# Patient Record
Sex: Female | Born: 1995 | Race: White | Hispanic: No | Marital: Single | Smoking: Never smoker
Health system: Southern US, Community
[De-identification: ages and names within clinical notes are randomized; demographics above are authoritative.]

## PROBLEM LIST (undated history)

## (undated) ENCOUNTER — Inpatient Hospital Stay (HOSPITAL_COMMUNITY): Payer: Self-pay

## (undated) DIAGNOSIS — R51 Headache: Secondary | ICD-10-CM

## (undated) DIAGNOSIS — R55 Syncope and collapse: Secondary | ICD-10-CM

## (undated) DIAGNOSIS — A609 Anogenital herpesviral infection, unspecified: Secondary | ICD-10-CM

## (undated) DIAGNOSIS — R197 Diarrhea, unspecified: Secondary | ICD-10-CM

## (undated) DIAGNOSIS — F32A Depression, unspecified: Secondary | ICD-10-CM

## (undated) DIAGNOSIS — B379 Candidiasis, unspecified: Secondary | ICD-10-CM

## (undated) DIAGNOSIS — R109 Unspecified abdominal pain: Secondary | ICD-10-CM

## (undated) DIAGNOSIS — G43909 Migraine, unspecified, not intractable, without status migrainosus: Secondary | ICD-10-CM

## (undated) DIAGNOSIS — T7840XA Allergy, unspecified, initial encounter: Secondary | ICD-10-CM

## (undated) DIAGNOSIS — F419 Anxiety disorder, unspecified: Secondary | ICD-10-CM

## (undated) DIAGNOSIS — K6289 Other specified diseases of anus and rectum: Secondary | ICD-10-CM

## (undated) HISTORY — DX: Anogenital herpesviral infection, unspecified: A60.9

## (undated) HISTORY — DX: Unspecified abdominal pain: R10.9

## (undated) HISTORY — PX: APPENDECTOMY: SHX54

## (undated) HISTORY — PX: OTHER SURGICAL HISTORY: SHX169

## (undated) HISTORY — DX: Other specified diseases of anus and rectum: K62.89

---
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
## 2016-01-09 ENCOUNTER — Ambulatory Visit (HOSPITAL_COMMUNITY)
Admission: RE | Admit: 2016-01-09 | Discharge: 2016-01-09 | Disposition: A | Discharge status: Home / Self Care | Payer: Medicaid Other | Source: Ambulatory Visit | Attending: Gastroenterology | Admitting: Gastroenterology

## 2016-01-09 DIAGNOSIS — R112 Nausea with vomiting, unspecified: Secondary | ICD-10-CM | POA: Diagnosis present

## 2016-01-09 DIAGNOSIS — R1115 Cyclical vomiting syndrome unrelated to migraine: Secondary | ICD-10-CM

## 2016-01-09 MED ORDER — TECHNETIUM TC 99M SULFUR COLLOID
1.9000 | Freq: Once | INTRAVENOUS | Status: AC | PRN
  Administered 2016-01-09: 1.9 via INTRAVENOUS

## 2016-02-25 ENCOUNTER — Encounter (HOSPITAL_COMMUNITY): Payer: Self-pay | Admitting: Emergency Medicine

## 2016-02-25 ENCOUNTER — Ambulatory Visit (HOSPITAL_COMMUNITY)
Admission: EM | Admit: 2016-02-25 | Discharge: 2016-02-25 | Disposition: A | Discharge status: Home / Self Care | Payer: Medicaid Other | Attending: Physician Assistant | Admitting: Physician Assistant

## 2016-02-25 DIAGNOSIS — H6691 Otitis media, unspecified, right ear: Secondary | ICD-10-CM | POA: Diagnosis not present

## 2016-02-25 MED ORDER — HYDROCODONE-ACETAMINOPHEN 5-325 MG PO TABS: 1.0000 | ORAL_TABLET | ORAL | Status: DC | PRN

## 2016-02-25 MED ORDER — AZITHROMYCIN 250 MG PO TABS: 250.0000 mg | ORAL_TABLET | Freq: Every day | ORAL | Status: DC

## 2016-02-25 NOTE — ED Notes (Signed)
[facility] a [facility] of right sided ear pain and a sore throat x 3 days.

## 2016-02-25 NOTE — ED Provider Notes (Signed)
CSN: [id number]     Arrival date & time 02/25/16  1720 History   None    Chief Complaint  Patient presents with  . Otalgia  . Sore Throat   (Consider location/radiation/quality/duration/timing/severity/associated sxs/prior Treatment) HPI History obtained from patient:   LOCATION: Right ear SEVERITY:6 DURATION: 2 days CONTEXT:sudden onset, had URI symptoms last week. QUALITY: Fullness MODIFYING FACTORS:OTC meds without relief ASSOCIATED SYMPTOMS:hurts to swallow, fever,   TIMING:now constant  Past Medical History  Diagnosis Date  . Constipation   . Proctalgia   . Allergy   . Headache(784.0)   . Abdominal pain   . Diarrhea   . Yeast infection   . Anxiety    Past Surgical History  Procedure Laterality Date  . Appendectomy  08/18/11  . Laparoscopic appendectomy  08/18/2011    Procedure: APPENDECTOMY LAPAROSCOPIC;  Surgeon: Judie Petit. Leonia Corona, MD;  Location: [facility] OR;  Service: Pediatrics;;  . Birth control implant in arm      Hx: of  . Esophagogastroduodenoscopy N/A 02/19/2013    Procedure: ESOPHAGOGASTRODUODENOSCOPY (EGD);  Surgeon: Jon Gills, MD;  Location: [facility] OR;  Service: Gastroenterology;  Laterality: N/A;  . Appendectomy     Family History  Problem Relation Age of Onset  . Arthritis Maternal Grandmother   . Hearing loss Maternal Grandmother   . Cancer Maternal Grandfather   . Bipolar disorder Brother   . Diabetes Maternal Grandmother    Social History  Substance Use Topics  . Smoking status: Current Some Day Smoker  . Smokeless tobacco: None  . Alcohol Use: No   OB History    Gravida Para Term Preterm AB TAB SAB Ectopic Multiple Living   0 0 0 0 0 0 0 0       Review of Systems Positive for left earache, upper respiratory symptoms Denies fever nausea, vomiting, diarrhea, shortness of breath, chest pain Allergies  Amoxicillin; Penicillins; Phenergan; and Penicillins  Home Medications   Prior to Admission medications   Medication Sig Start Date End  Date Taking? Authorizing Provider  azithromycin (ZITHROMAX) 250 MG tablet Take 1 tablet (250 mg total) by mouth daily. Take first 2 tablets together, then 1 every day until finished. 02/25/16   Tharon Aquas, PA  etonogestrel (IMPLANON) 68 MG IMPL implant Inject 1 each into the skin once. Reported on 11/20/2015    Historical Provider, MD  HYDROcodone-acetaminophen (NORCO/VICODIN) 5-325 MG tablet Take 1 tablet by mouth every 4 (four) hours as needed. 02/25/16   Tharon Aquas, PA  metoCLOPramide (REGLAN) 10 MG tablet Take 1 tablet (10 mg total) by mouth every 6 (six) hours as needed for nausea or vomiting. 11/20/15   Azalia Bilis, MD  metoCLOPramide (REGLAN) 10 MG tablet Take 10 mg by mouth 4 (four) times daily.    Historical Provider, MD  Ondansetron HCl (ZOFRAN PO) Take 1 tablet by mouth every 8 (eight) hours as needed (for nausea / vomiting).    Historical Provider, MD  sucralfate (CARAFATE) 1 GM/10ML suspension Take 10 mLs (1 g total) by mouth 4 (four) times daily -  with meals and at bedtime. 11/20/15   Azalia Bilis, MD  sucralfate (CARAFATE) 1 GM/10ML suspension Take 1 g by mouth 4 (four) times daily.    Historical Provider, MD   Meds Ordered and Administered this Visit  Medications - No data to display  BP 138/100 mmHg  Pulse 102  Temp(Src) 98 F (36.7 C) (Oral)  Resp 20  SpO2 100%  LMP 02/25/2016 (Exact Date) No  data found.   Physical Exam NURSES NOTES AND VITAL SIGNS REVIEWED. CONSTITUTIONAL: Well developed, well nourished, no acute distress HEENT: normocephalic, atraumatic, right TM is red bulging with poor light reflex and no motion left TM is normal. EYES: Conjunctiva normal NECK:normal ROM, supple, no adenopathy PULMONARY:No respiratory distress, normal effort ABDOMINAL: Soft, ND, NT BS+, No CVAT MUSCULOSKELETAL: Normal ROM of all extremities,  SKIN: warm and dry without rash PSYCHIATRIC: Mood and affect, behavior are normal  ED Course  Procedures (including critical care  time)  Labs Review Labs Reviewed - No data to display  Imaging Review No results found.   Visual Acuity Review  Right Eye Distance:   Left Eye Distance:   Bilateral Distance:    Right Eye Near:   Left Eye Near:    Bilateral Near:      Prescription for azithromycin patient is allergic to amoxicillin prescription for hydrocodone   MDM   1. Acute right otitis media, recurrence not specified, unspecified otitis media type     Patient is reassured that there are no issues that require transfer to higher level of care at this time or additional tests. Patient is advised to continue home symptomatic treatment. Patient is advised that if there are new or worsening symptoms to attend the emergency department, contact primary care provider, or return to UC. Instructions of care provided discharged home in stable condition.    THIS NOTE WAS GENERATED USING A VOICE RECOGNITION SOFTWARE PROGRAM. ALL REASONABLE EFFORTS  WERE MADE TO PROOFREAD THIS DOCUMENT FOR ACCURACY.  I have verbally reviewed the discharge instructions with the patient. A printed AVS was given to the patient.  All questions were answered prior to discharge.      Tharon AquasFrank C Patrick, PA 02/25/16 1745

## 2016-02-25 NOTE — Discharge Instructions (Signed)
Otitis Media, Adult °Otitis media is redness, soreness, and puffiness (swelling) in the space just behind your eardrum (middle ear). It may be caused by allergies or infection. It often happens along with a cold. °HOME CARE °· Take your medicine as told. Finish it even if you start to feel better. °· Only take over-the-counter or prescription medicines for pain, discomfort, or fever as told by your doctor. °· Follow up with your doctor as told. °GET HELP IF: °· You have otitis media only in one ear, or bleeding from your nose, or both. °· You notice a lump on your neck. °· You are not getting better in 3-5 days. °· You feel worse instead of better. °GET HELP RIGHT AWAY IF:  °· You have pain that is not helped with medicine. °· You have puffiness, redness, or pain around your ear. °· You get a stiff neck. °· You cannot move part of your face (paralysis). °· You notice that the bone behind your ear hurts when you touch it. °MAKE SURE YOU:  °· Understand these instructions. °· Will watch your condition. °· Will get help right away if you are not doing well or get worse. °  °This information is not intended to replace advice given to you by your health care provider. Make sure you discuss any questions you have with your health care provider. °  °Document Released: 03/18/2008 Document Revised: 10/21/2014 Document Reviewed: 04/27/2013 °Elsevier Interactive Patient Education ©2016 [employer] ° ° °

## 2016-04-18 ENCOUNTER — Emergency Department (HOSPITAL_COMMUNITY)
Admission: EM | Admit: 2016-04-18 | Discharge: 2016-04-18 | Disposition: A | Discharge status: Home / Self Care | Payer: Medicaid Other | Attending: Emergency Medicine | Admitting: Emergency Medicine

## 2016-04-18 ENCOUNTER — Encounter (HOSPITAL_COMMUNITY): Payer: Self-pay | Admitting: Emergency Medicine

## 2016-04-18 DIAGNOSIS — F17200 Nicotine dependence, unspecified, uncomplicated: Secondary | ICD-10-CM | POA: Diagnosis not present

## 2016-04-18 DIAGNOSIS — R1084 Generalized abdominal pain: Secondary | ICD-10-CM | POA: Diagnosis present

## 2016-04-18 DIAGNOSIS — F1290 Cannabis use, unspecified, uncomplicated: Secondary | ICD-10-CM | POA: Insufficient documentation

## 2016-04-18 DIAGNOSIS — R197 Diarrhea, unspecified: Secondary | ICD-10-CM | POA: Diagnosis not present

## 2016-04-18 DIAGNOSIS — R112 Nausea with vomiting, unspecified: Secondary | ICD-10-CM | POA: Diagnosis not present

## 2016-04-18 LAB — URINALYSIS, ROUTINE W REFLEX MICROSCOPIC
Bilirubin Urine: NEGATIVE
Glucose, UA: 500 mg/dL — AB
Ketones, ur: >80 mg/dL — AB
Leukocytes, UA: NEGATIVE
Nitrite: NEGATIVE
PH: 8.5 — AB (ref 5.0–8.0)
Protein, ur: 30 mg/dL — AB
SPECIFIC GRAVITY, URINE: 1.031 — AB (ref 1.005–1.030)

## 2016-04-18 LAB — URINE MICROSCOPIC-ADD ON

## 2016-04-18 LAB — CBC
HEMATOCRIT: 40.2 % (ref 36.0–46.0)
HEMOGLOBIN: 14.7 g/dL (ref 12.0–15.0)
MCH: 30.3 pg (ref 26.0–34.0)
MCHC: 36.6 g/dL — ABNORMAL HIGH (ref 30.0–36.0)
MCV: 82.9 fL (ref 78.0–100.0)
Platelets: 257 10*3/uL (ref 150–400)
RBC: 4.85 MIL/uL (ref 3.87–5.11)
RDW: 13 % (ref 11.5–15.5)
WBC: 12 10*3/uL — ABNORMAL HIGH (ref 4.0–10.5)

## 2016-04-18 LAB — COMPREHENSIVE METABOLIC PANEL
ALBUMIN: 4.5 g/dL (ref 3.5–5.0)
ALT: 14 U/L (ref 14–54)
ANION GAP: 13 (ref 5–15)
AST: 19 U/L (ref 15–41)
Alkaline Phosphatase: 67 U/L (ref 38–126)
BUN: 13 mg/dL (ref 6–20)
CALCIUM: 9.7 mg/dL (ref 8.9–10.3)
CHLORIDE: 109 mmol/L (ref 101–111)
CO2: 17 mmol/L — AB (ref 22–32)
Creatinine, Ser: 0.66 mg/dL (ref 0.44–1.00)
GFR calc non Af Amer: >60 mL/min (ref >60)
Glucose, Bld: 181 mg/dL — ABNORMAL HIGH (ref 65–99)
POTASSIUM: 3.6 mmol/L (ref 3.5–5.1)
SODIUM: 139 mmol/L (ref 135–145)
Total Bilirubin: 0.7 mg/dL (ref 0.3–1.2)
Total Protein: 7.5 g/dL (ref 6.5–8.1)

## 2016-04-18 LAB — I-STAT BETA HCG BLOOD, ED (MC, WL, AP ONLY)

## 2016-04-18 LAB — LIPASE, BLOOD: Lipase: 22 U/L (ref 11–51)

## 2016-04-18 MED ORDER — MORPHINE SULFATE (PF) 4 MG/ML IV SOLN
4.0000 mg | Freq: Once | INTRAVENOUS | Status: AC
  Administered 2016-04-18: 4 mg via INTRAVENOUS
  Filled 2016-04-18: qty 1

## 2016-04-18 MED ORDER — PROCHLORPERAZINE MALEATE 10 MG PO TABS: 10.0000 mg | ORAL_TABLET | Freq: Four times a day (QID) | ORAL | Status: DC | PRN

## 2016-04-18 MED ORDER — MORPHINE SULFATE (PF) 4 MG/ML IV SOLN
4.0000 mg | Freq: Once | INTRAVENOUS | Status: AC
Start: 2016-04-18 — End: 2016-04-18
  Administered 2016-04-18: 4 mg via INTRAVENOUS
  Filled 2016-04-18: qty 1

## 2016-04-18 MED ORDER — ONDANSETRON HCL 4 MG/2ML IJ SOLN
4.0000 mg | Freq: Once | INTRAMUSCULAR | Status: AC
  Administered 2016-04-18: 4 mg via INTRAVENOUS
  Filled 2016-04-18: qty 2

## 2016-04-18 MED ORDER — PROCHLORPERAZINE EDISYLATE 5 MG/ML IJ SOLN
10.0000 mg | Freq: Once | INTRAMUSCULAR | Status: AC
  Administered 2016-04-18: 10 mg via INTRAVENOUS
  Filled 2016-04-18: qty 2

## 2016-04-18 MED ORDER — SODIUM CHLORIDE 0.9 % IV BOLUS (SEPSIS)
2000.0000 mL | Freq: Once | INTRAVENOUS | Status: AC
  Administered 2016-04-18: 2000 mL via INTRAVENOUS

## 2016-04-18 MED ORDER — ONDANSETRON 4 MG PO TBDP
4.0000 mg | ORAL_TABLET | Freq: Once | ORAL | Status: AC | PRN
  Administered 2016-04-18: 4 mg via ORAL
  Filled 2016-04-18: qty 1

## 2016-04-18 NOTE — ED Notes (Signed)
Pt tolerating PO fluids

## 2016-04-18 NOTE — ED Notes (Signed)
Per Dr. Effie ShyWentz, pt can have PO fluids. Pt given Sprite.

## 2016-04-18 NOTE — ED Provider Notes (Signed)
CSN: [id number]     Arrival date & time 04/18/16  1233 History   First MD Initiated Contact with Patient 04/18/16 1445     Chief Complaint  Patient presents with  . Abdominal Pain  . Emesis     (Consider location/radiation/quality/duration/timing/severity/associated sxs/prior Treatment) HPI   [name] is a 20 y.o. female who presents for evaluation of nausea, vomiting, diarrhea, all of which started this morning. No emesis in either. No fever, chills, cough, shortness of breath, chest pain, weakness or dizziness. No known sick contacts, and no suspected abnormal food ingestion. She is not currently employed. There are no other known modifying factors.   Past Medical History  Diagnosis Date  . Constipation   . Proctalgia   . Allergy   . Headache(784.0)   . Abdominal pain   . Diarrhea   . Yeast infection   . Anxiety    Past Surgical History  Procedure Laterality Date  . Appendectomy  08/18/11  . Laparoscopic appendectomy  08/18/2011    Procedure: APPENDECTOMY LAPAROSCOPIC;  Surgeon: Judie PetitM. Leonia CoronaShuaib Farooqui, MD;  Location: [facility] OR;  Service: Pediatrics;;  . Birth control implant in arm      Hx: of  . Esophagogastroduodenoscopy N/A 02/19/2013    Procedure: ESOPHAGOGASTRODUODENOSCOPY (EGD);  Surgeon: Jon GillsJoseph H Clark, MD;  [facility];  Service: Gastroenterology;  Laterality: N/A;  . Appendectomy     Family History  Problem Relation Age of Onset  . Arthritis Maternal Grandmother   . Hearing loss Maternal Grandmother   . Cancer Maternal Grandfather   . Bipolar disorder Brother   . Diabetes Maternal Grandmother    Social History  Substance Use Topics  . Smoking status: Current Some Day Smoker  . Smokeless tobacco: None  . Alcohol Use: No   OB History    Gravida Para Term Preterm AB TAB SAB Ectopic Multiple Living   0 0 0 0 0 0 0 0       Review of Systems  All other systems reviewed and are negative.     Allergies  Amoxicillin; Penicillins; Phenergan; and  Penicillins  Home Medications   Prior to Admission medications   Medication Sig Start Date End Date Taking? Authorizing Provider  azithromycin (ZITHROMAX) 250 MG tablet Take 1 tablet (250 mg total) by mouth daily. Take first 2 tablets together, then 1 every day until finished. Patient not taking: Reported on 04/18/2016 02/25/16   Tharon AquasFrank C Patrick, PA  HYDROcodone-acetaminophen (NORCO/VICODIN) 5-325 MG tablet Take 1 tablet by mouth every 4 (four) hours as needed. Patient not taking: Reported on 04/18/2016 02/25/16   Tharon AquasFrank C Patrick, PA  metoCLOPramide (REGLAN) 10 MG tablet Take 1 tablet (10 mg total) by mouth every 6 (six) hours as needed for nausea or vomiting. Patient not taking: Reported on 04/18/2016 11/20/15   Azalia BilisKevin Campos, MD  prochlorperazine (COMPAZINE) 10 MG tablet Take 1 tablet (10 mg total) by mouth every 6 (six) hours as needed for nausea or vomiting. 04/18/16   Mancel BaleElliott Brandye Inthavong, MD  sucralfate (CARAFATE) 1 GM/10ML suspension Take 10 mLs (1 g total) by mouth 4 (four) times daily -  with meals and at bedtime. Patient not taking: Reported on 04/18/2016 11/20/15   Azalia BilisKevin Campos, MD   BP 94/79 mmHg  Pulse 75  Temp(Src) 97.7 F (36.5 C) (Oral)  Resp 18  SpO2 95% Physical Exam  Constitutional: She is oriented to person, place, and time. She appears well-developed and well-nourished.  HENT:  Head: Normocephalic and atraumatic.  Right  Ear: External ear normal.  Left Ear: External ear normal.  Eyes: Conjunctivae and EOM are normal. Pupils are equal, round, and reactive to light.  Neck: Normal range of motion and phonation normal. Neck supple.  Cardiovascular: Normal rate, regular rhythm and normal heart sounds.   Pulmonary/Chest: Effort normal and breath sounds normal. She exhibits no bony tenderness.  Abdominal: Soft. There is tenderness (Diffuse, mild).  Active vomiting, yellow emesis during the exam.  Musculoskeletal: Normal range of motion.  Neurological: She is alert and oriented to person,  place, and time. No cranial nerve deficit or sensory deficit. She exhibits normal muscle tone. Coordination normal.  Skin: Skin is warm, dry and intact.  Psychiatric: She has a normal mood and affect. Her behavior is normal. Judgment and thought content normal.  Nursing note and vitals reviewed.   ED Course  Procedures (including critical care time)  Medications  ondansetron (ZOFRAN-ODT) disintegrating tablet 4 mg (4 mg Oral Given 04/18/16 1250)  ondansetron (ZOFRAN) injection 4 mg (4 mg Intravenous Given 04/18/16 1600)  morphine 4 MG/ML injection 4 mg (4 mg Intravenous Given 04/18/16 1603)  sodium chloride 0.9 % bolus 2,000 mL (0 mLs Intravenous Stopped 04/18/16 1732)  prochlorperazine (COMPAZINE) injection 10 mg (10 mg Intravenous Given 04/18/16 1726)  morphine 4 MG/ML injection 4 mg (4 mg Intravenous Given 04/18/16 1753)    Patient Vitals for the past 24 hrs:  BP Temp Temp src Pulse Resp SpO2  04/18/16 1852 94/79 mmHg - - 75 18 95 %  04/18/16 1800 118/67 mmHg - - 85 - 98 %  04/18/16 1725 141/89 mmHg - - 103 - 100 %  04/18/16 1700 134/77 mmHg - - 77 - 100 %  04/18/16 1601 - - - 60 - 99 %  04/18/16 1600 115/65 mmHg - - - - -  04/18/16 1506 113/65 mmHg 97.7 F (36.5 C) Oral 104 18 96 %  04/18/16 1244 126/88 mmHg 97.7 F (36.5 C) Oral 81 16 100 %    4:53 PM Reevaluation with update and discussion. After initial assessment and treatment, an updated evaluation reveals She reports the pain is better but she still feels nauseated and like to try something different for nausea. She agreed to a trial of Compazine. [name] L   7:14 PM Reevaluation with update and discussion. After initial assessment and treatment, an updated evaluation reveals Calm, comfortable, ambulatory, smiling and tolerating oral liquids at this time. Findings discussed with patient and mother, all questions were answered. [name] L   Labs Review Labs Reviewed  COMPREHENSIVE METABOLIC PANEL - Abnormal; Notable for  the following:    CO2 17 (*)    Glucose, Bld 181 (*)    All other components within normal limits  CBC - Abnormal; Notable for the following:    WBC 12.0 (*)    MCHC 36.6 (*)    All other components within normal limits  URINALYSIS, ROUTINE W REFLEX MICROSCOPIC (NOT AT [facility]) - Abnormal; Notable for the following:    APPearance CLOUDY (*)    Specific Gravity, Urine 1.031 (*)    pH 8.5 (*)    Glucose, UA 500 (*)    Hgb urine dipstick TRACE (*)    Ketones, ur >80 (*)    Protein, ur 30 (*)    All other components within normal limits  URINE MICROSCOPIC-ADD ON - Abnormal; Notable for the following:    Squamous Epithelial / LPF 0-5 (*)    Bacteria, UA MANY (*)    All other  components within normal limits  LIPASE, BLOOD  I-STAT BETA HCG BLOOD, ED ([facility], [facility], [facility] ONLY)    Imaging Review No results found. I have personally reviewed and evaluated these images and lab results as part of my medical decision-making.   EKG Interpretation None      MDM   Final diagnoses:  Nausea vomiting and diarrhea    Nonspecific, nausea, vomiting, diarrhea. No evidence for serious bacterial infection. Metabolic instability or impending vascular collapse  Nursing Notes Reviewed/ Care Coordinated Applicable Imaging Reviewed Interpretation of Laboratory Data incorporated into ED treatment  The patient appears reasonably screened and/or stabilized for discharge and I doubt any other medical condition or other [facility] requiring further screening, evaluation, or treatment in the ED at this time prior to discharge.  Plan: Home Medications- Compazine; Home Treatments- Gradually advance diet; return here if the recommended treatment, does not improve the symptoms; Recommended follow up- PCP prn   Mancel Bale, MD 04/18/16 1919

## 2016-04-18 NOTE — ED Notes (Signed)
Pt requesting additional pain medication. MD made aware. 

## 2016-04-18 NOTE — ED Notes (Signed)
Nausea, vomiting, diarrhea starting this morning and continuing. Vomiting in triage. Abdominal pain in triage, chills. Denies urinary problems. Pt state there is a chance she could be pregnant.

## 2016-04-18 NOTE — Discharge Instructions (Signed)
Nausea and Vomiting °Nausea is a sick feeling that often comes before throwing up (vomiting). Vomiting is a reflex where stomach contents come out of your mouth. Vomiting can cause severe loss of body fluids (dehydration). Children and elderly adults can become dehydrated quickly, especially if they also have diarrhea. Nausea and vomiting are symptoms of a condition or disease. It is important to find the cause of your symptoms. °CAUSES  °· Direct irritation of the stomach lining. This irritation can result from increased acid production (gastroesophageal reflux disease), infection, food poisoning, taking certain medicines (such as nonsteroidal anti-inflammatory drugs), alcohol use, or tobacco use. °· Signals from the brain. These signals could be caused by a headache, heat exposure, an inner ear disturbance, increased pressure in the brain from injury, infection, a tumor, or a concussion, pain, emotional stimulus, or metabolic problems. °· An obstruction in the gastrointestinal tract (bowel obstruction). °· Illnesses such as diabetes, hepatitis, gallbladder problems, appendicitis, kidney problems, cancer, sepsis, atypical symptoms of a heart attack, or eating disorders. °· Medical treatments such as chemotherapy and radiation. °· Receiving medicine that makes you sleep (general anesthetic) during surgery. °DIAGNOSIS °Your caregiver may ask for tests to be done if the problems do not improve after a few days. Tests may also be done if symptoms are severe or if the reason for the nausea and vomiting is not clear. Tests may include: °· Urine tests. °· Blood tests. °· Stool tests. °· Cultures (to look for evidence of infection). °· X-rays or other imaging studies. °Test results can help your caregiver make decisions about treatment or the need for additional tests. °TREATMENT °You need to stay well hydrated. Drink frequently but in small amounts. You may wish to drink water, sports drinks, clear broth, or eat frozen  ice pops or gelatin dessert to help stay hydrated. When you eat, eating slowly may help prevent nausea. There are also some antinausea medicines that may help prevent nausea. °HOME CARE INSTRUCTIONS  °· Take all medicine as directed by your caregiver. °· If you do not have an appetite, do not force yourself to eat. However, you must continue to drink fluids. °· If you have an appetite, eat a normal diet unless your caregiver tells you differently. °· Eat a variety of complex carbohydrates (rice, wheat, potatoes, bread), lean meats, yogurt, fruits, and vegetables. °· Avoid high-fat foods because they are more difficult to digest. °· Drink enough water and fluids to keep your urine clear or pale yellow. °· If you are dehydrated, ask your caregiver for specific rehydration instructions. Signs of dehydration may include: °· Severe thirst. °· Dry lips and mouth. °· Dizziness. °· Dark urine. °· Decreasing urine frequency and amount. °· Confusion. °· Rapid breathing or pulse. °SEEK IMMEDIATE MEDICAL CARE IF:  °· You have blood or brown flecks (like coffee grounds) in your vomit. °· You have [place] or bloody stools. °· You have a severe headache or stiff neck. °· You are confused. °· You have severe abdominal pain. °· You have chest pain or trouble breathing. °· You do not urinate at least once every 8 hours. °· You develop cold or clammy skin. °· You continue to vomit for longer than 24 to 48 hours. °· You have a fever. °MAKE SURE YOU:  °· Understand these instructions. °· Will watch your condition. °· Will get help right away if you are not doing well or get worse. °  °This information is not intended to replace advice given to you by your health care provider. Make sure   you discuss any questions you have with your health care provider.   Document Released: 09/30/2005 Document Revised: 12/23/2011 Document Reviewed: 02/27/2011 Elsevier Interactive Patient Education 2016 [place].  Diarrhea Diarrhea is frequent  loose and watery bowel movements. It can cause you to feel weak and dehydrated. Dehydration can cause you to become tired and thirsty, have a dry mouth, and have decreased urination that often is dark yellow. Diarrhea is a sign of another problem, most often an infection that will not last long. In most cases, diarrhea typically lasts 2-3 days. However, it can last longer if it is a sign of something more serious. It is important to treat your diarrhea as directed by your caregiver to lessen or prevent future episodes of diarrhea. CAUSES  Some common causes include:  Gastrointestinal infections caused by viruses, bacteria, or parasites.  Food poisoning or food allergies.  Certain medicines, such as antibiotics, chemotherapy, and laxatives.  Artificial sweeteners and fructose.  Digestive disorders. HOME CARE INSTRUCTIONS  Ensure adequate fluid intake (hydration): Have 1 cup (8 oz) of fluid for each diarrhea episode. Avoid fluids that contain simple sugars or sports drinks, fruit juices, whole milk products, and sodas. Your urine should be clear or pale yellow if you are drinking enough fluids. Hydrate with an oral rehydration solution that you can purchase at pharmacies, retail stores, and online. You can prepare an oral rehydration solution at home by mixing the following ingredients together:   - tsp table salt.   tsp baking soda.   tsp salt substitute containing potassium chloride.  1  tablespoons sugar.  1 L (34 oz) of water.  Certain foods and beverages may increase the speed at which food moves through the gastrointestinal (GI) tract. These foods and beverages should be avoided and include:  Caffeinated and alcoholic beverages.  High-fiber foods, such as raw fruits and vegetables, nuts, seeds, and whole grain breads and cereals.  Foods and beverages sweetened with sugar alcohols, such as xylitol, sorbitol, and mannitol.  Some foods may be well tolerated and may help thicken  stool including:  Starchy foods, such as rice, toast, pasta, low-sugar cereal, oatmeal, grits, baked potatoes, crackers, and bagels.  Bananas.  Applesauce.  Add probiotic-rich foods to help increase healthy bacteria in the GI tract, such as yogurt and fermented milk products.  Wash your hands well after each diarrhea episode.  Only take over-the-counter or prescription medicines as directed by your caregiver.  Take a warm bath to relieve any burning or pain from frequent diarrhea episodes. SEEK IMMEDIATE MEDICAL CARE IF:   You are unable to keep fluids down.  You have persistent vomiting.  You have blood in your stool, or your stools are Golding and tarry.  You do not urinate in 6-8 hours, or there is only a small amount of very dark urine.  You have abdominal pain that increases or localizes.  You have weakness, dizziness, confusion, or light-headedness.  You have a severe headache.  Your diarrhea gets worse or does not get better.  You have a fever or persistent symptoms for more than 2-3 days.  You have a fever and your symptoms suddenly get worse. MAKE SURE YOU:   Understand these instructions.  Will watch your condition.  Will get help right away if you are not doing well or get worse.   This information is not intended to replace advice given to you by your health care provider. Make sure you discuss any questions you have with your health care  provider.   Document Released: 09/20/2002 Document Revised: 10/21/2014 Document Reviewed: 06/07/2012 Elsevier Interactive Patient Education 2016 [employer]  Food Choices to Help Relieve Diarrhea, Adult When you have diarrhea, the foods you eat and your eating habits are very important. Choosing the right foods and drinks can help relieve diarrhea. Also, because diarrhea can last up to 7 days, you need to replace lost fluids and electrolytes (such as sodium, potassium, and chloride) in order to help prevent dehydration.   WHAT GENERAL GUIDELINES DO I NEED TO FOLLOW?  Slowly drink 1 cup (8 oz) of fluid for each episode of diarrhea. If you are getting enough fluid, your urine will be clear or pale yellow.  Eat starchy foods. Some good choices include white rice, white toast, pasta, low-fiber cereal, baked potatoes (without the skin), saltine crackers, and bagels.  Avoid large servings of any cooked vegetables.  Limit fruit to two servings per day. A serving is  cup or 1 small piece.  Choose foods with less than 2 g of fiber per serving.  Limit fats to less than 8 tsp (38 g) per day.  Avoid fried foods.  Eat foods that have probiotics in them. Probiotics can be found in certain dairy products.  Avoid foods and beverages that may increase the speed at which food moves through the stomach and intestines (gastrointestinal tract). Things to avoid include:  High-fiber foods, such as dried fruit, raw fruits and vegetables, nuts, seeds, and whole grain foods.  Spicy foods and high-fat foods.  Foods and beverages sweetened with high-fructose corn syrup, honey, or sugar alcohols such as xylitol, sorbitol, and mannitol. WHAT FOODS ARE RECOMMENDED? Grains White rice. White, [place], or pita breads (fresh or toasted), including plain rolls, buns, or bagels. White pasta. Saltine, soda, or graham crackers. Pretzels. Low-fiber cereal. Cooked cereals made with water (such as cornmeal, farina, or cream cereals). Plain muffins. Matzo. Melba toast. Zwieback.  Vegetables Potatoes (without the skin). Strained tomato and vegetable juices. Most well-cooked and canned vegetables without seeds. Tender lettuce. Fruits Cooked or canned applesauce, apricots, cherries, fruit cocktail, grapefruit, peaches, pears, or plums. Fresh bananas, apples without skin, cherries, grapes, cantaloupe, grapefruit, peaches, oranges, or plums.  Meat and Other Protein Products Baked or boiled chicken. Eggs. Tofu. Fish. Seafood. Smooth peanut butter.  Ground or well-cooked tender beef, ham, veal, lamb, pork, or poultry.  Dairy Plain yogurt, kefir, and unsweetened liquid yogurt. Lactose-free milk, buttermilk, or soy milk. Plain hard cheese. Beverages Sport drinks. Clear broths. Diluted fruit juices (except prune). Regular, caffeine-free sodas such as ginger ale. Water. Decaffeinated teas. Oral rehydration solutions. Sugar-free beverages not sweetened with sugar alcohols. Other Bouillon, broth, or soups made from recommended foods.  The items listed above may not be a complete list of recommended foods or beverages. Contact your dietitian for more options. WHAT FOODS ARE NOT RECOMMENDED? Grains Whole grain, whole wheat, bran, or rye breads, rolls, pastas, crackers, and cereals. Wild or brown rice. Cereals that contain more than 2 g of fiber per serving. Corn tortillas or taco shells. Cooked or dry oatmeal. Granola. Popcorn. Vegetables Raw vegetables. Cabbage, broccoli, Brussels sprouts, artichokes, baked beans, beet greens, corn, kale, legumes, peas, sweet potatoes, and yams. Potato skins. Cooked spinach and cabbage. Fruits Dried fruit, including raisins and dates. Raw fruits. Stewed or dried prunes. Fresh apples with skin, apricots, mangoes, pears, raspberries, and strawberries.  Meat and Other Protein Products Chunky peanut butter. Nuts and seeds. Beans and lentils. Tomasa BlaseBacon.  Dairy High-fat cheeses. Milk, chocolate milk,  and beverages made with milk, such as milk shakes. Cream. Ice cream. Sweets and Desserts Sweet rolls, doughnuts, and sweet breads. Pancakes and waffles. Fats and Oils Butter. Cream sauces. Margarine. Salad oils. Plain salad dressings. Olives. Avocados.  Beverages Caffeinated beverages (such as coffee, tea, soda, or energy drinks). Alcoholic beverages. Fruit juices with pulp. Prune juice. Soft drinks sweetened with high-fructose corn syrup or sugar alcohols. Other Coconut. Hot sauce. Chili powder. Mayonnaise. Gravy.  Cream-based or milk-based soups.  The items listed above may not be a complete list of foods and beverages to avoid. Contact your dietitian for more information. WHAT SHOULD I DO IF I BECOME DEHYDRATED? Diarrhea can sometimes lead to dehydration. Signs of dehydration include dark urine and dry mouth and skin. If you think you are dehydrated, you should rehydrate with an oral rehydration solution. These solutions can be purchased at pharmacies, retail stores, or online.  Drink -1 cup (120-240 mL) of oral rehydration solution each time you have an episode of diarrhea. If drinking this amount makes your diarrhea worse, try drinking smaller amounts more often. For example, drink 1-3 tsp (5-15 mL) every 5-10 minutes.  A general rule for staying hydrated is to drink 1-2 L of fluid per day. Talk to your health care provider about the specific amount you should be drinking each day. Drink enough fluids to keep your urine clear or pale yellow.   This information is not intended to replace advice given to you by your health care provider. Make sure you discuss any questions you have with your health care provider.   Document Released: 12/21/2003 Document Revised: 10/21/2014 Document Reviewed: 08/23/2013 Elsevier Interactive Patient Education [employer]vier Inc[employer]

## 2016-06-20 ENCOUNTER — Ambulatory Visit (HOSPITAL_COMMUNITY)
Admission: EM | Admit: 2016-06-20 | Discharge: 2016-06-20 | Disposition: A | Discharge status: Home / Self Care | Payer: Medicaid Other | Attending: Internal Medicine | Admitting: Internal Medicine

## 2016-06-20 ENCOUNTER — Encounter (HOSPITAL_COMMUNITY): Payer: Self-pay | Admitting: Emergency Medicine

## 2016-06-20 DIAGNOSIS — J069 Acute upper respiratory infection, unspecified: Secondary | ICD-10-CM

## 2016-06-20 MED ORDER — HYDROCODONE-ACETAMINOPHEN 7.5-325 MG/15ML PO SOLN: 10.0000 mL | Freq: Four times a day (QID) | ORAL | 0 refills | Status: DC | PRN

## 2016-06-20 NOTE — ED Provider Notes (Signed)
CSN: [id number]     Arrival date & time 06/20/16  1321 History   First MD Initiated Contact with Patient 06/20/16 1421     Chief Complaint  Patient presents with  . URI   (Consider location/radiation/quality/duration/timing/severity/associated sxs/prior Treatment) HPI History obtained from patient:  Pt presents with the cc of:  Cough and fever Duration of symptoms: One week Treatment prior to arrival: Over-the-counter medications without relief of symptoms Context: Patient states that she has had cold symptoms for a week now unable to sleep due to cough has a history of bronchitis in the past. Other symptoms include: Painful chest from coughing Pain score: 3 FAMILY HISTORY: Headaches    Past Medical History:  Diagnosis Date  . Abdominal pain   . Allergy   . Anxiety   . Constipation   . Diarrhea   . Headache(784.0)   . Proctalgia   . Yeast infection    Past Surgical History:  Procedure Laterality Date  . APPENDECTOMY  08/18/11  . APPENDECTOMY    . birth control implant in arm     Hx: of  . ESOPHAGOGASTRODUODENOSCOPY N/A 02/19/2013   Procedure: ESOPHAGOGASTRODUODENOSCOPY (EGD);  Surgeon: Jon GillsJoseph H Clark, MD;  [facility];  Service: Gastroenterology;  Laterality: N/A;  . LAPAROSCOPIC APPENDECTOMY  08/18/2011   Procedure: APPENDECTOMY LAPAROSCOPIC;  Surgeon: Judie PetitM. Leonia CoronaShuaib Farooqui, MD;  Location: [facility] OR;  Service: Pediatrics;;   Family History  Problem Relation Age of Onset  . Arthritis Maternal Grandmother   . Hearing loss Maternal Grandmother   . Diabetes Maternal Grandmother   . Cancer Maternal Grandfather   . Bipolar disorder Brother    Social History  Substance Use Topics  . Smoking status: Current Some Day Smoker  . Smokeless tobacco: Never Used  . Alcohol use No   OB History    Gravida Para Term Preterm AB Living   0 0 0 0 0     SAB TAB Ectopic Multiple Live Births   0 0 0         Review of Systems  Denies: HEADACHE, NAUSEA, ABDOMINAL PAIN, CHEST PAIN,  CONGESTION, DYSURIA, SHORTNESS OF BREATH  Allergies  Amoxicillin; Penicillins; Phenergan [promethazine]; and Penicillins  Home Medications   Prior to Admission medications   Medication Sig Start Date End Date Taking? Authorizing Provider  azithromycin (ZITHROMAX) 250 MG tablet Take 1 tablet (250 mg total) by mouth daily. Take first 2 tablets together, then 1 every day until finished. Patient not taking: Reported on 04/18/2016 02/25/16   Tharon AquasFrank C Briahnna Harries, PA  HYDROcodone-acetaminophen (HYCET) 7.5-325 mg/15 ml solution Take 10 mLs by mouth 4 (four) times daily as needed for moderate pain. 06/20/16   Tharon AquasFrank C Artemis Loyal, PA  HYDROcodone-acetaminophen (NORCO/VICODIN) 5-325 MG tablet Take 1 tablet by mouth every 4 (four) hours as needed. Patient not taking: Reported on 04/18/2016 02/25/16   Tharon AquasFrank C Saory Carriero, PA  metoCLOPramide (REGLAN) 10 MG tablet Take 1 tablet (10 mg total) by mouth every 6 (six) hours as needed for nausea or vomiting. Patient not taking: Reported on 04/18/2016 11/20/15   Azalia BilisKevin Campos, MD  prochlorperazine (COMPAZINE) 10 MG tablet Take 1 tablet (10 mg total) by mouth every 6 (six) hours as needed for nausea or vomiting. 04/18/16   Mancel BaleElliott Wentz, MD  sucralfate (CARAFATE) 1 GM/10ML suspension Take 10 mLs (1 g total) by mouth 4 (four) times daily -  with meals and at bedtime. Patient not taking: Reported on 04/18/2016 11/20/15   Azalia BilisKevin Campos, MD   Meds Ordered  and Administered this Visit  Medications - No data to display  BP 118/77 (BP Location: Right Arm)   Pulse 71   Temp 98 F (36.7 C) (Oral)   Resp 18   LMP 06/13/2016   SpO2 99%  No data found.   Physical Exam NURSES NOTES AND VITAL SIGNS REVIEWED. CONSTITUTIONAL: Well developed, well nourished, no acute distress HEENT: normocephalic, atraumatic EYES: Conjunctiva normal NECK:normal ROM, supple, no adenopathy PULMONARY:No respiratory distress, normal effort ABDOMINAL: Soft, ND, NT BS+, No CVAT MUSCULOSKELETAL: Normal ROM of all  extremities,  SKIN: warm and dry without rash PSYCHIATRIC: Mood and affect, behavior are normal  Urgent Care Course   Clinical Course    Procedures (including critical care time)  Labs Review Labs Reviewed - No data to display  Imaging Review No results found.   Visual Acuity Review  Right Eye Distance:   Left Eye Distance:   Bilateral Distance:    Right Eye Near:   Left Eye Near:    Bilateral Near:        Hycet cough medicine provided. No antibiotics needed. MDM   1. Acute upper respiratory infection     Patient is reassured that there are no issues that require transfer to higher level of care at this time or additional tests. Patient is advised to continue home symptomatic treatment. Patient is advised that if there are new or worsening symptoms to attend the emergency department, contact primary care provider, or return to UC. Instructions of care provided discharged home in stable condition.    THIS NOTE WAS GENERATED USING A VOICE RECOGNITION SOFTWARE PROGRAM. ALL REASONABLE EFFORTS  WERE MADE TO PROOFREAD THIS DOCUMENT FOR ACCURACY.  I have verbally reviewed the discharge instructions with the patient. A printed AVS was given to the patient.  All questions were answered prior to discharge.      Tharon Aquas, PA 06/20/16 1540

## 2016-06-20 NOTE — ED Triage Notes (Signed)
Here for cold sx onset 4-5 days associated w/CP, cough, fever, runny nose, congestion  A&O x4... NAD

## 2016-08-22 LAB — OB RESULTS CONSOLE ABO/RH: RH TYPE: POSITIVE

## 2016-08-22 LAB — OB RESULTS CONSOLE GC/CHLAMYDIA
Chlamydia: NEGATIVE
Gonorrhea: NEGATIVE

## 2016-08-22 LAB — OB RESULTS CONSOLE RUBELLA ANTIBODY, IGM: RUBELLA: IMMUNE

## 2016-08-22 LAB — OB RESULTS CONSOLE HEPATITIS B SURFACE ANTIGEN: HEP B S AG: NEGATIVE

## 2016-08-22 LAB — OB RESULTS CONSOLE HIV ANTIBODY (ROUTINE TESTING): HIV: NONREACTIVE

## 2016-08-22 LAB — OB RESULTS CONSOLE ANTIBODY SCREEN: ANTIBODY SCREEN: NEGATIVE

## 2016-08-22 LAB — OB RESULTS CONSOLE RPR: RPR: NONREACTIVE

## 2016-10-14 NOTE — L&D Delivery Note (Signed)
Delivery Note At 1:31 AM a viable female was delivered via Vaginal, Spontaneous Delivery (Presentation: vtx; LOA ).  APGAR: 8, 9; weight  pending.   Placenta status: spontaneous, intact.  Cord:  with the following complications: none.   Anesthesia:  Epidural Episiotomy:  None Lacerations: 1st degree Suture Repair: 2.0 vicryl rapide Est. Blood Loss (mL): 200  Mom to postpartum.  Baby to Couplet care / Skin to Skin.  They do not want circumcision.    Leighton Roachodd D Yazhini Mcaulay 03/14/2017, 1:50 AM

## 2017-02-12 ENCOUNTER — Encounter (HOSPITAL_COMMUNITY): Payer: Self-pay

## 2017-02-12 ENCOUNTER — Inpatient Hospital Stay (HOSPITAL_COMMUNITY)
Admission: AD | Admit: 2017-02-12 | Discharge: 2017-02-13 | Disposition: A | Discharge status: Home / Self Care | Payer: Medicaid Other | Source: Ambulatory Visit | Attending: Obstetrics and Gynecology | Admitting: Obstetrics and Gynecology

## 2017-02-12 DIAGNOSIS — O9989 Other specified diseases and conditions complicating pregnancy, childbirth and the puerperium: Secondary | ICD-10-CM | POA: Insufficient documentation

## 2017-02-12 DIAGNOSIS — O99333 Smoking (tobacco) complicating pregnancy, third trimester: Secondary | ICD-10-CM | POA: Insufficient documentation

## 2017-02-12 DIAGNOSIS — F17200 Nicotine dependence, unspecified, uncomplicated: Secondary | ICD-10-CM | POA: Insufficient documentation

## 2017-02-12 DIAGNOSIS — O99343 Other mental disorders complicating pregnancy, third trimester: Secondary | ICD-10-CM | POA: Diagnosis not present

## 2017-02-12 DIAGNOSIS — Z880 Allergy status to penicillin: Secondary | ICD-10-CM | POA: Diagnosis not present

## 2017-02-12 DIAGNOSIS — R51 Headache: Secondary | ICD-10-CM | POA: Insufficient documentation

## 2017-02-12 DIAGNOSIS — O09893 Supervision of other high risk pregnancies, third trimester: Secondary | ICD-10-CM | POA: Insufficient documentation

## 2017-02-12 DIAGNOSIS — R197 Diarrhea, unspecified: Secondary | ICD-10-CM | POA: Diagnosis not present

## 2017-02-12 DIAGNOSIS — Z3A35 35 weeks gestation of pregnancy: Secondary | ICD-10-CM | POA: Insufficient documentation

## 2017-02-12 DIAGNOSIS — F419 Anxiety disorder, unspecified: Secondary | ICD-10-CM | POA: Insufficient documentation

## 2017-02-12 DIAGNOSIS — O98813 Other maternal infectious and parasitic diseases complicating pregnancy, third trimester: Secondary | ICD-10-CM | POA: Diagnosis not present

## 2017-02-12 DIAGNOSIS — B379 Candidiasis, unspecified: Secondary | ICD-10-CM | POA: Diagnosis not present

## 2017-02-12 LAB — URINALYSIS, ROUTINE W REFLEX MICROSCOPIC
BILIRUBIN URINE: NEGATIVE
Glucose, UA: NEGATIVE mg/dL
Hgb urine dipstick: NEGATIVE
KETONES UR: 20 mg/dL — AB
NITRITE: NEGATIVE
PH: 6 (ref 5.0–8.0)
Protein, ur: 30 mg/dL — AB
Specific Gravity, Urine: 1.016 (ref 1.005–1.030)

## 2017-02-12 MED ORDER — AZITHROMYCIN 250 MG PO TABS
1000.0000 mg | ORAL_TABLET | Freq: Once | ORAL | Status: AC
  Administered 2017-02-12: 1000 mg via ORAL
  Filled 2017-02-12: qty 4

## 2017-02-12 MED ORDER — LACTATED RINGERS IV BOLUS (SEPSIS)
1000.0000 mL | Freq: Once | INTRAVENOUS | Status: AC
  Administered 2017-02-12: 1000 mL via INTRAVENOUS

## 2017-02-12 NOTE — MAU Note (Signed)
Pt states she has had contractions that started about an hour ago that are every 5 mins. Pt denies vag bleeding or LOF. +FM. States she has had diarrhea for several days but has had 7-8 episodes today.

## 2017-02-12 NOTE — MAU Note (Signed)
Urine sent to lab 

## 2017-02-12 NOTE — MAU Provider Note (Signed)
History     CSN: [id number]  Arrival date and time: 02/12/17 2015   First Provider Initiated Contact with Patient 02/12/17 2100      Chief Complaint  Patient presents with  . Diarrhea   Diarrhea   This is a new problem. Episode onset: 4 days ago. The problem occurs 5 to 10 times per day. The problem has been unchanged. The stool consistency is described as watery. The patient states that diarrhea awakens her from sleep. Associated symptoms include abdominal pain. Pertinent negatives include no chills, fever or vomiting. Nothing aggravates the symptoms. There are no known risk factors. She has tried nothing for the symptoms.   Past Medical History:  Diagnosis Date  . Abdominal pain   . Allergy   . Anxiety   . Constipation   . Diarrhea   . Headache(784.0)   . Proctalgia   . Yeast infection     Past Surgical History:  Procedure Laterality Date  . APPENDECTOMY  08/18/11  . APPENDECTOMY    . birth control implant in arm     Hx: of  . ESOPHAGOGASTRODUODENOSCOPY N/A 02/19/2013   Procedure: ESOPHAGOGASTRODUODENOSCOPY (EGD);  Surgeon: Jon Gills, MD;  Location: [facility] OR;  Service: Gastroenterology;  Laterality: N/A;  . LAPAROSCOPIC APPENDECTOMY  08/18/2011   Procedure: APPENDECTOMY LAPAROSCOPIC;  Surgeon: Judie Petit. Leonia Corona, MD;  Location: [facility] OR;  Service: Pediatrics;;    Family History  Problem Relation Age of Onset  . Arthritis Maternal Grandmother   . Hearing loss Maternal Grandmother   . Diabetes Maternal Grandmother   . Cancer Maternal Grandfather   . Bipolar disorder Brother     Social History  Substance Use Topics  . Smoking status: Current Some Day Smoker  . Smokeless tobacco: Never Used  . Alcohol use No    Allergies:  Allergies  Allergen Reactions  . Amoxicillin Hives  . Penicillins Hives    Has patient had a PCN reaction causing immediate rash, facial/tongue/throat swelling, SOB or lightheadedness with hypotension: Yes Has patient had a PCN reaction  causing severe rash involving mucus membranes or skin necrosis: Yes Has patient had a PCN reaction that required hospitalization unknown Has patient had a PCN reaction occurring within the last 10 years: unknown If all of the above answers are "NO", then may proceed with Cephalosporin use.   Marland Kitchen Phenergan [Promethazine] Nausea And Vomiting and Other (See Comments)    Doesn't work for patient  . Penicillins     Hives, vomiting    Prescriptions Prior to Admission  Medication Sig Dispense Refill Last Dose  . azithromycin (ZITHROMAX) 250 MG tablet Take 1 tablet (250 mg total) by mouth daily. Take first 2 tablets together, then 1 every day until finished. (Patient not taking: Reported on 04/18/2016) 6 tablet 0 Unknown at Unknown time  . HYDROcodone-acetaminophen (HYCET) 7.5-325 mg/15 ml solution Take 10 mLs by mouth 4 (four) times daily as needed for moderate pain. 120 mL 0   . HYDROcodone-acetaminophen (NORCO/VICODIN) 5-325 MG tablet Take 1 tablet by mouth every 4 (four) hours as needed. (Patient not taking: Reported on 04/18/2016) 6 tablet 0 Unknown at Unknown time  . metoCLOPramide (REGLAN) 10 MG tablet Take 1 tablet (10 mg total) by mouth every 6 (six) hours as needed for nausea or vomiting. (Patient not taking: Reported on 04/18/2016) 30 tablet 0 Unknown at Unknown time  . prochlorperazine (COMPAZINE) 10 MG tablet Take 1 tablet (10 mg total) by mouth every 6 (six) hours as needed for nausea or  vomiting. 20 tablet 0 Unknown at Unknown time  . sucralfate (CARAFATE) 1 GM/10ML suspension Take 10 mLs (1 g total) by mouth 4 (four) times daily -  with meals and at bedtime. (Patient not taking: Reported on 04/18/2016) 420 mL 0     Review of Systems  Constitutional: Negative for chills and fever.  Gastrointestinal: Positive for abdominal pain and diarrhea. Negative for nausea and vomiting.  Genitourinary: Positive for pelvic pain (contractions for about 3 days, irregular. No change over time. ). Negative for  vaginal bleeding and vaginal discharge.   Physical Exam   Blood pressure 113/84, pulse (!) 123, temperature 97.9 F (36.6 C), temperature source Oral, resp. rate 20, height  (1.651 m), weight 210 lb (95.3 kg), last menstrual period 06/13/2016, SpO2 99 %.  Physical Exam  Nursing note and vitals reviewed. Constitutional: She is oriented to person, place, and time. She appears well-developed and well-nourished. No distress.  HENT:  Head: Normocephalic.  Cardiovascular: Normal rate.   Respiratory: Effort normal.  GI: Soft. There is no tenderness. There is no rebound.  Neurological: She is alert and oriented to person, place, and time.  Skin: Skin is warm and dry.  Psychiatric: She has a normal mood and affect.   Cervix: FT/THICK/Ballotable   FHT: 145, moderate with 15x15 accels, no decels  Toco: irregular UCs  MAU Course  Procedures  MDM Stool sample given by patient. Pea soup green stool c/w possible salmonella.  1g Azithromycin given here. Will treat BID x 7 days  0011: D/W Dr. Ellyn Hack, ok for DC home.   Assessment and Plan   1. Diarrhea of presumed infectious origin   2. [redacted] weeks gestation of pregnancy    DC home Comfort measures reviewed  1st/2nd/3rd Trimester precautions  Bleeding precautions Ectopic precautions PTL precautions  Fetal kick counts RX: azithromycin  BID x 7 days #14  Return to MAU as needed FU with OB as planned  Follow-up Information    Sherian Rein, MD Follow up.   Specialty:  Obstetrics and Gynecology Contact information: [address] [place] [address] [phone]           Thressa Sheller 02/12/2017, 9:01 PM

## 2017-02-13 ENCOUNTER — Other Ambulatory Visit: Payer: Self-pay | Admitting: Advanced Practice Midwife

## 2017-02-13 DIAGNOSIS — O9989 Other specified diseases and conditions complicating pregnancy, childbirth and the puerperium: Secondary | ICD-10-CM | POA: Diagnosis not present

## 2017-02-13 DIAGNOSIS — R197 Diarrhea, unspecified: Secondary | ICD-10-CM

## 2017-02-13 LAB — GASTROINTESTINAL PANEL BY PCR, STOOL (REPLACES STOOL CULTURE)
ADENOVIRUS F40/41: NOT DETECTED
ASTROVIRUS: DETECTED — AB
CAMPYLOBACTER SPECIES: NOT DETECTED
CRYPTOSPORIDIUM: NOT DETECTED
Cyclospora cayetanensis: NOT DETECTED
ENTEROPATHOGENIC E COLI (EPEC): NOT DETECTED
ENTEROTOXIGENIC E COLI (ETEC): NOT DETECTED
Entamoeba histolytica: NOT DETECTED
Enteroaggregative E coli (EAEC): NOT DETECTED
GIARDIA LAMBLIA: NOT DETECTED
Norovirus GI/GII: NOT DETECTED
PLESIMONAS SHIGELLOIDES: NOT DETECTED
ROTAVIRUS A: NOT DETECTED
Salmonella species: NOT DETECTED
Sapovirus (I, II, IV, and V): NOT DETECTED
Shiga like toxin producing E coli (STEC): NOT DETECTED
Shigella/Enteroinvasive E coli (EIEC): NOT DETECTED
VIBRIO SPECIES: NOT DETECTED
Vibrio cholerae: NOT DETECTED
YERSINIA ENTEROCOLITICA: NOT DETECTED

## 2017-02-13 MED ORDER — AZITHROMYCIN 500 MG PO TABS: 500.0000 mg | ORAL_TABLET | Freq: Two times a day (BID) | ORAL | 0 refills | Status: DC

## 2017-02-13 NOTE — Progress Notes (Signed)
Lab called with critical result of PCR positive for Astrovirus.  Reslts negative for Salmonella.  Notified Dr Jackelyn KnifeMeisinger.

## 2017-02-13 NOTE — Discharge Instructions (Signed)
Salmonella Gastroenteritis, Adult Salmonella gastroenteritis is an infection of the intestines that can cause nausea, vomiting, and other symptoms. The infection usually lasts from 2 to 7 days. What are the causes? This condition is caused by salmonella bacteria. These bacteria can spread through a person's stool. You can get this infection by:  Eating food or drinking liquids that have the bacteria on it.  Drinking polluted, standing water.  Coming into contact with an animal that is carrying the bacteria. What increases the risk? This condition is more likely to develop in:  Elderly adults.  People with a weakened immune system.  People with poor personal or kitchen hygiene. What are the signs or symptoms? Symptoms of this condition include:  Nausea.  Vomiting.  Abdominal pain or cramps.  Diarrhea, which may be bloody.  Fever.  A headache. How is this diagnosed? This condition may be diagnosed based on your medical history, a physical exam, and a blood or stool test. How is this treated? Often, the only treatment needed is to drink plenty of fluids. Drinking fluids is important because this infection can make you dehydrated. If your condition is severe, you may be prescribed an antibiotic medicine to help shorten the illness. Most people recover completely, but some people may develop lasting problems such as arthritis, irritation of the eyes, or painful urination. Follow these instructions at home: Drinking   Drink enough fluid to keep your urine clear or pale yellow. This helps prevent dehydration.  Until your diarrhea, nausea, or vomiting is under control, drink only clear liquids. Clear liquids are liquids you can see through, such as water, broth, or non-caffeinated tea. Avoid milk, fruit juice, alcohol, and very hot or very cold drinks.  If you are dehydrated, ask your health care provider for specific rehydration instructions. Signs of dehydration  include:  Thirst.  Dry lips or mouth.  Dry, warm skin.  Dark urine.  A headache. Eating and drinking   If you are not hungry, do not force yourself to eat.  If you are hungry, eat a well-balanced diet that includes:  A variety of complex carbohydrates, such as rice, wheat, potatoes, and bread.  Lean meats.  Yogurt.  Fruits.  Vegetables.  Avoid high-fat foods. They are difficult to digest.  If you have diarrhea, do not prepare food. Medicines   Take over-the-counter and prescription medicines only as told by your health care provider.  If you were prescribed an antibiotic medicine, take it as told by your health care provider. Do not stop taking the antibiotic even if you start to feel better. Other Instructions    Wash your hands well. This helps keep the bacteria from spreading to others.  Keep track of changes in your weight. Losing a lot of weight can be a sign of a serious problem. Ask your health care provider how much weight loss should concern you.  Keep all follow-up visits as told by your health care provider. How is this prevented? To prevent future salmonella infections:  Handle meat, eggs, seafood, and poultry properly.  Always cook meat, eggs, seafood, and poultry thoroughly.  Wash your hands and counters thoroughly after handling or preparing meat, eggs, seafood, and poultry.  Wash your hands thoroughly after handling animals. Get help right away if:  You cannot keep fluids down.  You keep vomiting or having diarrhea.  You have abdominal pain that gets worse.  You have abdominal pain in one small area.  Your diarrhea has more blood or mucus than  before.  You have a fever.  You have any symptoms of severe dehydration. These include:  Extreme thirst.  Confusion.  Inability to sweat.  Fainting.  Dizziness.  Weight loss. This information is not intended to replace advice given to you by your health care provider. Make sure you  discuss any questions you have with your health care provider. Document Released: 09/27/2000 Document Revised: 03/07/2016 Document Reviewed: 06/24/2015 Elsevier Interactive Patient Education  2017 [employer].

## 2017-02-17 LAB — OB RESULTS CONSOLE GBS: GBS: NEGATIVE

## 2017-03-12 ENCOUNTER — Telehealth (HOSPITAL_COMMUNITY): Payer: Self-pay | Admitting: *Deleted

## 2017-03-12 ENCOUNTER — Encounter (HOSPITAL_COMMUNITY): Payer: Self-pay | Admitting: *Deleted

## 2017-03-12 NOTE — Telephone Encounter (Signed)
Preadmission screen  

## 2017-03-13 ENCOUNTER — Encounter (HOSPITAL_COMMUNITY): Payer: Self-pay

## 2017-03-13 ENCOUNTER — Inpatient Hospital Stay (HOSPITAL_COMMUNITY)
Admission: AD | Admit: 2017-03-13 | Discharge: 2017-03-15 | DRG: 775 | Disposition: A | Discharge status: Home / Self Care | Payer: Medicaid Other | Source: Ambulatory Visit | Attending: Obstetrics and Gynecology | Admitting: Obstetrics and Gynecology

## 2017-03-13 ENCOUNTER — Inpatient Hospital Stay (HOSPITAL_COMMUNITY): Payer: Medicaid Other | Admitting: Anesthesiology

## 2017-03-13 ENCOUNTER — Encounter (HOSPITAL_COMMUNITY): Payer: Self-pay | Admitting: *Deleted

## 2017-03-13 ENCOUNTER — Inpatient Hospital Stay (HOSPITAL_COMMUNITY)
Admission: AD | Admit: 2017-03-13 | Discharge: 2017-03-13 | Disposition: A | Discharge status: Home / Self Care | Payer: Medicaid Other | Source: Ambulatory Visit | Attending: Obstetrics and Gynecology | Admitting: Obstetrics and Gynecology

## 2017-03-13 DIAGNOSIS — F17200 Nicotine dependence, unspecified, uncomplicated: Secondary | ICD-10-CM | POA: Diagnosis present

## 2017-03-13 DIAGNOSIS — Z880 Allergy status to penicillin: Secondary | ICD-10-CM | POA: Diagnosis not present

## 2017-03-13 DIAGNOSIS — O99334 Smoking (tobacco) complicating childbirth: Secondary | ICD-10-CM | POA: Diagnosis present

## 2017-03-13 DIAGNOSIS — Z3A39 39 weeks gestation of pregnancy: Secondary | ICD-10-CM

## 2017-03-13 DIAGNOSIS — Z3493 Encounter for supervision of normal pregnancy, unspecified, third trimester: Secondary | ICD-10-CM | POA: Diagnosis present

## 2017-03-13 DIAGNOSIS — O471 False labor at or after 37 completed weeks of gestation: Secondary | ICD-10-CM

## 2017-03-13 LAB — CBC
HCT: 37.7 % (ref 36.0–46.0)
Hemoglobin: 13.1 g/dL (ref 12.0–15.0)
MCH: 29.3 pg (ref 26.0–34.0)
MCHC: 34.7 g/dL (ref 30.0–36.0)
MCV: 84.3 fL (ref 78.0–100.0)
PLATELETS: 232 10*3/uL (ref 150–400)
RBC: 4.47 MIL/uL (ref 3.87–5.11)
RDW: 13.7 % (ref 11.5–15.5)
WBC: 17.2 10*3/uL — ABNORMAL HIGH (ref 4.0–10.5)

## 2017-03-13 LAB — TYPE AND SCREEN
ABO/RH(D): B POS
Antibody Screen: NEGATIVE

## 2017-03-13 LAB — ABO/RH: ABO/RH(D): B POS

## 2017-03-13 MED ORDER — PHENYLEPHRINE 40 MCG/ML (10ML) SYRINGE FOR IV PUSH (FOR BLOOD PRESSURE SUPPORT): 80.0000 ug | PREFILLED_SYRINGE | INTRAVENOUS | Status: DC | PRN

## 2017-03-13 MED ORDER — OXYCODONE-ACETAMINOPHEN 5-325 MG PO TABS
1.0000 | ORAL_TABLET | ORAL | Status: DC | PRN
  Administered 2017-03-14: 1 via ORAL
  Filled 2017-03-13: qty 1

## 2017-03-13 MED ORDER — OXYCODONE-ACETAMINOPHEN 5-325 MG PO TABS: 2.0000 | ORAL_TABLET | ORAL | Status: DC | PRN

## 2017-03-13 MED ORDER — OXYTOCIN BOLUS FROM INFUSION
500.0000 mL | Freq: Once | INTRAVENOUS | Status: AC
  Administered 2017-03-14: 500 mL via INTRAVENOUS

## 2017-03-13 MED ORDER — PHENYLEPHRINE 40 MCG/ML (10ML) SYRINGE FOR IV PUSH (FOR BLOOD PRESSURE SUPPORT)
80.0000 ug | PREFILLED_SYRINGE | INTRAVENOUS | Status: DC | PRN
  Filled 2017-03-13: qty 5

## 2017-03-13 MED ORDER — EPHEDRINE 5 MG/ML INJ: 10.0000 mg | INTRAVENOUS | Status: DC | PRN

## 2017-03-13 MED ORDER — FENTANYL 2.5 MCG/ML BUPIVACAINE 1/10 % EPIDURAL INFUSION (WH - ANES)
14.0000 mL/h | INTRAMUSCULAR | Status: DC | PRN
  Administered 2017-03-13: 14 mL/h via EPIDURAL

## 2017-03-13 MED ORDER — EPHEDRINE 5 MG/ML INJ
10.0000 mg | INTRAVENOUS | Status: DC | PRN
  Filled 2017-03-13: qty 2

## 2017-03-13 MED ORDER — DIPHENHYDRAMINE HCL 50 MG/ML IJ SOLN: 12.5000 mg | INTRAMUSCULAR | Status: DC | PRN

## 2017-03-13 MED ORDER — OXYTOCIN 40 UNITS IN LACTATED RINGERS INFUSION - SIMPLE MED
2.5000 [IU]/h | INTRAVENOUS | Status: DC
  Administered 2017-03-14: 2.5 [IU]/h via INTRAVENOUS
  Filled 2017-03-13: qty 1000

## 2017-03-13 MED ORDER — LACTATED RINGERS IV SOLN
INTRAVENOUS | Status: DC
  Administered 2017-03-13: 17:00:00 via INTRAVENOUS

## 2017-03-13 MED ORDER — LACTATED RINGERS IV SOLN
500.0000 mL | Freq: Once | INTRAVENOUS | Status: AC
  Administered 2017-03-13: 500 mL via INTRAVENOUS

## 2017-03-13 MED ORDER — LACTATED RINGERS IV SOLN: 500.0000 mL | Freq: Once | INTRAVENOUS | Status: DC

## 2017-03-13 MED ORDER — SOD CITRATE-CITRIC ACID 500-334 MG/5ML PO SOLN
30.0000 mL | ORAL | Status: DC | PRN
Start: 2017-03-13 — End: 2017-03-15

## 2017-03-13 MED ORDER — ACETAMINOPHEN 325 MG PO TABS: 650.0000 mg | ORAL_TABLET | ORAL | Status: DC | PRN

## 2017-03-13 MED ORDER — ONDANSETRON HCL 4 MG/2ML IJ SOLN: 4.0000 mg | Freq: Four times a day (QID) | INTRAMUSCULAR | Status: DC | PRN

## 2017-03-13 MED ORDER — BUTORPHANOL TARTRATE 1 MG/ML IJ SOLN: 1.0000 mg | INTRAMUSCULAR | Status: DC | PRN

## 2017-03-13 MED ORDER — LIDOCAINE HCL (PF) 1 % IJ SOLN
INTRAMUSCULAR | Status: DC | PRN
  Administered 2017-03-13: 5 mL via EPIDURAL

## 2017-03-13 MED ORDER — LIDOCAINE HCL (PF) 1 % IJ SOLN
30.0000 mL | INTRAMUSCULAR | Status: DC | PRN
  Filled 2017-03-13: qty 30

## 2017-03-13 MED ORDER — FENTANYL 2.5 MCG/ML BUPIVACAINE 1/10 % EPIDURAL INFUSION (WH - ANES)
14.0000 mL/h | INTRAMUSCULAR | Status: DC | PRN
  Administered 2017-03-13: 14 mL/h via EPIDURAL
  Filled 2017-03-13: qty 100

## 2017-03-13 MED ORDER — LACTATED RINGERS IV SOLN: 500.0000 mL | INTRAVENOUS | Status: DC | PRN

## 2017-03-13 MED ORDER — PHENYLEPHRINE 40 MCG/ML (10ML) SYRINGE FOR IV PUSH (FOR BLOOD PRESSURE SUPPORT)
80.0000 ug | PREFILLED_SYRINGE | INTRAVENOUS | Status: DC | PRN
  Filled 2017-03-13: qty 10
  Filled 2017-03-13: qty 5

## 2017-03-13 NOTE — Anesthesia Pain Management Evaluation Note (Signed)
  CRNA Pain Management Visit Note  Patient: [name], 21 y.o., female  "Hello I am a member of the anesthesia team at [facility]. We have an anesthesia team available at all times to provide care throughout the hospital, including epidural management and anesthesia for C-section. I don't know your plan for the delivery whether it a natural birth, water birth, IV sedation, nitrous supplementation, doula or epidural, but we want to meet your pain goals."   1.Was your pain managed to your expectations on prior hospitalizations?   Yes   2.What is your expectation for pain management during this hospitalization?     Epidural  3.How can we help you reach that goal? epidural  Record the patient's initial score and the patient's pain goal.   Pain: 2  Pain Goal: 4 [facility] wants you to be able to say your pain was always managed very well.  [name] 03/13/2017

## 2017-03-13 NOTE — MAU Note (Signed)
PT  SAYS SHE FEELS  STRONG  UC  SINCE 2300.   VE IN OFFICE  YESTERDAY - 2  CM.    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    GBS- NEG.   MRSA.-   NEG

## 2017-03-13 NOTE — Anesthesia Preprocedure Evaluation (Signed)
Anesthesia Evaluation  Patient identified by MRN, date of birth, ID band Patient awake    Reviewed: Allergy & Precautions, H&P , NPO status , Patient's Chart, lab work & pertinent test results  Airway Mallampati: II   Neck ROM: full    Dental   Pulmonary Current Smoker,    breath sounds clear to auscultation       Cardiovascular negative cardio ROS   Rhythm:regular Rate:Normal     Neuro/Psych  Headaches, PSYCHIATRIC DISORDERS Anxiety    GI/Hepatic   Endo/Other    Renal/GU      Musculoskeletal   Abdominal   Peds  Hematology   Anesthesia Other Findings   Reproductive/Obstetrics (+) Pregnancy                             Anesthesia Physical Anesthesia Plan  ASA: II  Anesthesia Plan: Epidural   Post-op Pain Management:    Induction: Intravenous  Airway Management Planned: Natural Airway  Additional Equipment:   Intra-op Plan:   Post-operative Plan:   Informed Consent: I have reviewed the patients History and Physical, chart, labs and discussed the procedure including the risks, benefits and alternatives for the proposed anesthesia with the patient or authorized representative who has indicated his/her understanding and acceptance.     Plan Discussed with: Anesthesiologist  Anesthesia Plan Comments:         Anesthesia Quick Evaluation

## 2017-03-13 NOTE — H&P (Signed)
[name] is a 21 y.o. female, G1P0, EGA 39+ weeks with EDC 6-5 presenting for ctx.  She was in MAU this am for ctx, VE 3 cm and did not change.  She comes back now for ctx, VE 4 cm.  Prenatal care uncomplicated.  OB History    Gravida Para Term Preterm AB Living   1 0 0 0 0     SAB TAB Ectopic Multiple Live Births   0 0 0         Past Medical History:  Diagnosis Date  . Abdominal pain   . Allergy   . Anxiety   . Constipation   . Diarrhea   . Headache(784.0)   . HSV (herpes simplex virus) anogenital infection   . Proctalgia   . Yeast infection    Past Surgical History:  Procedure Laterality Date  . APPENDECTOMY  08/18/11  . APPENDECTOMY    . birth control implant in arm     Hx: of  . ESOPHAGOGASTRODUODENOSCOPY N/A 02/19/2013   Procedure: ESOPHAGOGASTRODUODENOSCOPY (EGD);  Surgeon: Jon Gills, MD;  Location: [facility] OR;  Service: Gastroenterology;  Laterality: N/A;  . LAPAROSCOPIC APPENDECTOMY  08/18/2011   Procedure: APPENDECTOMY LAPAROSCOPIC;  Surgeon: Judie Petit. Leonia Corona, MD;  Location: [facility] OR;  Service: Pediatrics;;   Family History: family history includes Arthritis in her maternal grandmother; Bipolar disorder in her brother; Cancer in her maternal grandfather; Diabetes in her maternal grandmother; Hearing loss in her maternal grandmother. Social History:  reports that she has been smoking.  She has never used smokeless tobacco. She reports that she uses drugs, including Marijuana. She reports that she does not drink alcohol.     Maternal Diabetes: No Genetic Screening: Normal Maternal Ultrasounds/Referrals: Normal Fetal Ultrasounds or other Referrals:  None Maternal Substance Abuse:  No Significant Maternal Medications:  None Significant Maternal Lab Results:  Lab values include: Group B Strep negative Other Comments:  None  Review of Systems  Respiratory: Negative.   Cardiovascular: Negative.    Maternal Medical History:  Reason for admission: Contractions.    Contractions: Frequency: regular.   Perceived severity is moderate.    Fetal activity: Perceived fetal activity is normal.    Prenatal complications: no prenatal complications Prenatal Complications - Diabetes: none.   AROM clear Dilation: 5 Effacement (%): 90 Station: -1 Exam by:: Dr. Jackelyn Knife Blood pressure 132/82, pulse (!) 110, temperature 97.9 F (36.6 C), temperature source Oral, resp. rate 16, height 5\' 6"  (1.676 m), weight 99.8 kg (220 lb), last menstrual period 06/13/2016, SpO2 99 %. Maternal Exam:  Uterine Assessment: Contraction strength is moderate.  Contraction frequency is regular.   Abdomen: Patient reports no abdominal tenderness. Estimated fetal weight is 8 lbs.   Fetal presentation: vertex  Introitus: Normal vulva. Normal vagina.  Amniotic fluid character: clear.  Pelvis: adequate for delivery.   Cervix: Cervix evaluated by digital exam.     Fetal Exam Fetal Monitor Review: Mode: ultrasound.   Baseline rate: 130-140.  Variability: moderate (6-25 bpm).   Pattern: accelerations present and no decelerations.    Fetal State Assessment: Category I - tracings are normal.     Physical Exam  Vitals reviewed. Constitutional: She appears well-developed and well-nourished.  Cardiovascular: Normal rate and regular rhythm.   Respiratory: Effort normal. No respiratory distress.  GI: Soft.    Prenatal labs: ABO, Rh: B/Positive/-- (11/09 0000) Antibody: Negative (11/09 0000) Rubella: Immune (11/09 0000) RPR: Nonreactive (11/09 0000)  HBsAg: Negative (11/09 0000)  HIV: Non-reactive (11/09 0000)  GBS: Negative (05/07 0000)   Assessment/Plan: IUP at 39+ weeks in active labor.  AROM done for augmentation, monitor progress, anticipate SVD.   Leighton Roachodd D Conor Lata 03/13/2017, 5:14 PM

## 2017-03-13 NOTE — Anesthesia Procedure Notes (Signed)
Epidural Patient location during procedure: OB Start time: 03/13/2017 7:06 PM End time: 03/13/2017 7:15 PM  Staffing Anesthesiologist: Chaney MallingHODIERNE, Owin Vignola Performed: anesthesiologist   Preanesthetic Checklist Completed: patient identified, site marked, pre-op evaluation, timeout performed, IV checked, risks and benefits discussed and monitors and equipment checked  Epidural Patient position: sitting Prep: DuraPrep Patient monitoring: heart rate, cardiac monitor, continuous pulse ox and blood pressure Approach: midline Location: L2-L3 Injection technique: LOR saline  Needle:  Needle type: Tuohy  Needle gauge: 17 G Needle length: 9 cm Needle insertion depth: 7 cm Catheter type: closed end flexible Catheter size: 19 Gauge Catheter at skin depth: 12 cm Test dose: negative and Other  Assessment Events: blood not aspirated, injection not painful, no injection resistance and negative IV test  Additional Notes Informed consent obtained prior to proceeding including risk of failure, 1% risk of PDPH, risk of minor discomfort and bruising.  Discussed rare but serious complications including epidural abscess, permanent nerve injury, epidural hematoma.  Discussed alternatives to epidural analgesia and patient desires to proceed.  Timeout performed pre-procedure verifying patient name, procedure, and platelet count.  Patient tolerated procedure well. Reason for block:procedure for pain

## 2017-03-13 NOTE — MAU Note (Signed)
Here earlier today for labor check. Was 3/80/-2. Ctx are stronger and closer. Reports bloody show. No leaking. Good fetal movement.

## 2017-03-13 NOTE — MAU Note (Signed)
Pt reports contractions that started at 11pm, now every 4-5 mins. Pt denies LOF or vaginal bleeding. Reports good fetal movement.

## 2017-03-14 ENCOUNTER — Encounter (HOSPITAL_COMMUNITY): Payer: Self-pay | Admitting: *Deleted

## 2017-03-14 LAB — RPR: RPR: NONREACTIVE

## 2017-03-14 MED ORDER — OXYCODONE HCL 5 MG PO TABS: 10.0000 mg | ORAL_TABLET | ORAL | Status: DC | PRN

## 2017-03-14 MED ORDER — DIPHENHYDRAMINE HCL 25 MG PO CAPS: 25.0000 mg | ORAL_CAPSULE | Freq: Four times a day (QID) | ORAL | Status: DC | PRN

## 2017-03-14 MED ORDER — DIBUCAINE 1 % RE OINT: 1.0000 "application " | TOPICAL_OINTMENT | RECTAL | Status: DC | PRN

## 2017-03-14 MED ORDER — OXYCODONE HCL 5 MG PO TABS
5.0000 mg | ORAL_TABLET | ORAL | Status: DC | PRN
  Administered 2017-03-15: 5 mg via ORAL
  Filled 2017-03-14: qty 1

## 2017-03-14 MED ORDER — SENNOSIDES-DOCUSATE SODIUM 8.6-50 MG PO TABS
2.0000 | ORAL_TABLET | ORAL | Status: DC
  Administered 2017-03-14: 2 via ORAL
  Filled 2017-03-14: qty 2

## 2017-03-14 MED ORDER — MAGNESIUM HYDROXIDE 400 MG/5ML PO SUSP: 30.0000 mL | ORAL | Status: DC | PRN

## 2017-03-14 MED ORDER — WITCH HAZEL-GLYCERIN EX PADS: 1.0000 "application " | MEDICATED_PAD | CUTANEOUS | Status: DC | PRN

## 2017-03-14 MED ORDER — PRENATAL MULTIVITAMIN CH
1.0000 | ORAL_TABLET | Freq: Every day | ORAL | Status: DC
  Administered 2017-03-14 – 2017-03-15 (×2): 1 via ORAL
  Filled 2017-03-14 (×2): qty 1

## 2017-03-14 MED ORDER — ONDANSETRON HCL 4 MG PO TABS: 4.0000 mg | ORAL_TABLET | ORAL | Status: DC | PRN

## 2017-03-14 MED ORDER — COCONUT OIL OIL: 1.0000 "application " | TOPICAL_OIL | Status: DC | PRN

## 2017-03-14 MED ORDER — TETANUS-DIPHTH-ACELL PERTUSSIS 5-2.5-18.5 LF-MCG/0.5 IM SUSP: 0.5000 mL | Freq: Once | INTRAMUSCULAR | Status: DC

## 2017-03-14 MED ORDER — IBUPROFEN 600 MG PO TABS
600.0000 mg | ORAL_TABLET | Freq: Four times a day (QID) | ORAL | Status: DC
  Administered 2017-03-14 – 2017-03-15 (×5): 600 mg via ORAL
  Filled 2017-03-14 (×5): qty 1

## 2017-03-14 MED ORDER — SIMETHICONE 80 MG PO CHEW: 80.0000 mg | CHEWABLE_TABLET | ORAL | Status: DC | PRN

## 2017-03-14 MED ORDER — METHYLERGONOVINE MALEATE 0.2 MG/ML IJ SOLN: 0.2000 mg | INTRAMUSCULAR | Status: DC | PRN

## 2017-03-14 MED ORDER — BENZOCAINE-MENTHOL 20-0.5 % EX AERO: 1.0000 "application " | INHALATION_SPRAY | CUTANEOUS | Status: DC | PRN

## 2017-03-14 MED ORDER — ACETAMINOPHEN 325 MG PO TABS: 650.0000 mg | ORAL_TABLET | ORAL | Status: DC | PRN

## 2017-03-14 MED ORDER — ONDANSETRON HCL 4 MG/2ML IJ SOLN: 4.0000 mg | INTRAMUSCULAR | Status: DC | PRN

## 2017-03-14 MED ORDER — ZOLPIDEM TARTRATE 5 MG PO TABS: 5.0000 mg | ORAL_TABLET | Freq: Every evening | ORAL | Status: DC | PRN

## 2017-03-14 MED ORDER — MEASLES, MUMPS & RUBELLA VAC ~~LOC~~ INJ
0.5000 mL | INJECTION | Freq: Once | SUBCUTANEOUS | Status: DC
  Filled 2017-03-14: qty 0.5

## 2017-03-14 MED ORDER — METHYLERGONOVINE MALEATE 0.2 MG PO TABS: 0.2000 mg | ORAL_TABLET | ORAL | Status: DC | PRN

## 2017-03-14 NOTE — Lactation Note (Signed)
This note was copied from a baby's chart. Lactation Consultation Note  Patient Name: [name] FAOZH'YToday's Date: 03/14/2017 Reason for consult: Initial assessment  Baby 9 hours old. Mom reports that she was having some trouble latching the baby to her right breast, but she states that the baby is latching well now. Mom states that she is able to hand express with colostrum flowing bilaterally. Offered to assist with a latch, but mom declined stating that she will nurse again when the baby wakes and cues. Mom has baby STS, and enc to call for assistance as needed. Mom given [facility], aware of OP/BFSG and LC phone line assistance after D/C.   Maternal Data Has patient been taught Hand Expression?: Yes Does the patient have breastfeeding experience prior to this delivery?: No  Feeding Feeding Type: Breast Fed Length of feed: 5 min  LATCH Score/Interventions                      Lactation Tools Discussed/Used     Consult Status Consult Status: Follow-up Date: 03/15/17 Follow-up type: In-patient    Sherlyn HayJennifer D Ed Rayson 03/14/2017, 10:36 AM

## 2017-03-14 NOTE — Anesthesia Postprocedure Evaluation (Signed)
Anesthesia Post Note  Patient: [name]  Procedure(s) Performed: * No procedures listed *     Patient location during evaluation: Mother Baby Anesthesia Type: Epidural Level of consciousness: awake and alert Pain management: pain level controlled Vital Signs Assessment: post-procedure vital signs reviewed and stable Respiratory status: spontaneous breathing Cardiovascular status: blood pressure returned to baseline Postop Assessment: no headache, adequate PO intake, no backache, patient able to bend at knees and no signs of nausea or vomiting Anesthetic complications: yes    Last Vitals:  Vitals:   03/14/17 0421 03/14/17 0810  BP: (!) 105/59 124/72  Pulse: (!) 113 (!) 124  Resp: 18 18  Temp: 37.2 C 37.2 C    Last Pain:  Vitals:   03/14/17 1238  TempSrc:   PainSc: 0-No pain   Pain Goal: Patients Stated Pain Goal: 0 (03/13/17 1532)               Salome ArntSterling, Azarie Coriz Marie

## 2017-03-14 NOTE — MAU Note (Signed)
I have communicated with Dr. Jackelyn KnifeMeisinger and reviewed vital signs:   113/73 P-86 R-18 T-98.6   Vaginal exam:, 3/80/-1,-2  Also reviewed contraction pattern and that non-stress test is reactive.  It has been documented that patient is contracting every 6-8 minutes with minimal cervical change over 1.5 hours not indicating active labor.  Patient denies any other complaints.  Based on this report provider has given order for discharge.  A discharge order and diagnosis entered by a provider.   Labor discharge instructions reviewed with patient.

## 2017-03-14 NOTE — Anesthesia Postprocedure Evaluation (Signed)
Anesthesia Post Note  Patient: [name]  Procedure(s) Performed: * No procedures listed *     Patient location during evaluation: Mother Baby Anesthesia Type: Epidural Level of consciousness: awake Pain management: pain level controlled Vital Signs Assessment: post-procedure vital signs reviewed and stable Respiratory status: spontaneous breathing Cardiovascular status: stable Postop Assessment: no headache, no backache, epidural receding, patient able to bend at knees, no signs of nausea or vomiting and adequate PO intake Anesthetic complications: no    Last Vitals:  Vitals:   03/14/17 0421 03/14/17 0810  BP: (!) 105/59 124/72  Pulse: (!) 113 (!) 124  Resp: 18 18  Temp: 37.2 C 37.2 C    Last Pain:  Vitals:   03/14/17 1355  TempSrc:   PainSc: 1    Pain Goal: Patients Stated Pain Goal: 0 (03/13/17 1532)               Rochel Privett

## 2017-03-14 NOTE — Progress Notes (Signed)
UR chart review completed.  

## 2017-03-14 NOTE — Progress Notes (Signed)
Patient ID: [name], female   DOB: 03/18/1996, 21 y.o.   MRN: [id number] PPD#0  Pt doing well. Pain controlled with meds. Ambulating and tolerating diet well. +Voids with no issues. Bonding well with baby - breastfeeding. Has no complaints of HA, CP or SOB VSS ABD - FF EXT - no edema or homans  A/P: PPD#0 s/p svd - stable         Routine pp care

## 2017-03-15 MED ORDER — IBUPROFEN 600 MG PO TABS: 600.0000 mg | ORAL_TABLET | Freq: Four times a day (QID) | ORAL | 1 refills | Status: DC | PRN

## 2017-03-15 NOTE — Discharge Summary (Signed)
OB Discharge Summary     Patient Name: [name] DOB: 1996/04/03 MRN: [id number]  Date of admission: 03/13/2017 Delivering MD: Jackelyn Knife, TODD   Date of discharge: 03/15/2017  Admitting diagnosis: [redacted]w[redacted]d, CTX  Intrauterine pregnancy: [redacted]w[redacted]d     Secondary diagnosis:  Active Problems:   Indication for care in labor or delivery   SVD (spontaneous vaginal delivery)  Additional problems: none      Discharge diagnosis: Term Pregnancy Delivered                                                                                                Post partum procedures:none  Augmentation: AROM  Complications: None  Hospital course:  Onset of Labor With Vaginal Delivery     21 y.o. yo G1P1001 at [redacted]w[redacted]d was admitted in Latent Labor on 03/13/2017. Patient had an uncomplicated labor course as follows:  Membrane Rupture Time/Date: 5:12 PM ,03/13/2017   Intrapartum Procedures: Episiotomy:                                           Lacerations:  1st degree [2]  Patient had a delivery of a Viable infant. 03/14/2017  Information for the patient's newborn:  Phebe, Dettmer [[id number]]  Delivery Method: Vag-Spont    Pateint had an uncomplicated postpartum course.  She is ambulating, tolerating a regular diet, passing flatus, and urinating well. Patient is discharged home in stable condition on 03/15/17.   Physical exam  Vitals:   03/14/17 0421 03/14/17 0810 03/14/17 1900 03/15/17 0630  BP: (!) 105/59 124/72 110/68 134/70  Pulse: (!) 113 (!) 124 79 (!) 109  Resp: 18 18 20 20   Temp: 98.9 F (37.2 C) 98.9 F (37.2 C) 98.2 F (36.8 C) 98.6 F (37 C)  TempSrc: Oral Oral Oral Oral  SpO2:  98%    Weight:      Height:       General: alert, cooperative and no distress Lochia: appropriate Uterine Fundus: firm Incision: N/A DVT Evaluation: No evidence of DVT seen on physical exam. Labs: Lab Results  Component Value Date   WBC 17.2 (H) 03/13/2017   HGB 13.1 03/13/2017   HCT 37.7  03/13/2017   MCV 84.3 03/13/2017   PLT 232 03/13/2017   CMP Latest Ref Rng & Units 04/18/2016  Glucose 65 - 99 mg/dL 782(N)  BUN 6 - 20 mg/dL 13  Creatinine 5.62 - 1.30 mg/dL 8.65  Sodium 784 - 696 mmol/L 139  Potassium 3.5 - 5.1 mmol/L 3.6  Chloride 101 - 111 mmol/L 109  CO2 22 - 32 mmol/L 17(L)  Calcium 8.9 - 10.3 mg/dL 9.7  Total Protein 6.5 - 8.1 g/dL 7.5  Total Bilirubin 0.3 - 1.2 mg/dL 0.7  Alkaline Phos 38 - 126 U/L 67  AST 15 - 41 U/L 19  ALT 14 - 54 U/L 14    Discharge instruction: per After Visit Summary and "Baby and Me Booklet".  After visit meds:  Allergies as of 03/15/2017  Reactions   Amoxicillin Hives   Penicillins Hives   Has patient had a PCN reaction causing immediate rash, facial/tongue/throat swelling, SOB or lightheadedness with hypotension: Yes Has patient had a PCN reaction causing severe rash involving mucus membranes or skin necrosis: Yes Has patient had a PCN reaction that required hospitalization unknown Has patient had a PCN reaction occurring within the last 10 years: unknown If all of the above answers are "NO", then may proceed with Cephalosporin use.   Phenergan [promethazine] Nausea And Vomiting, Other (See Comments)   Doesn't work for patient   Penicillins    Hives, vomiting      Medication List    STOP taking these medications   azithromycin 500 MG tablet Commonly known as:  ZITHROMAX     TAKE these medications   ibuprofen 600 MG tablet Commonly known as:  ADVIL,MOTRIN Take 1 tablet (600 mg total) by mouth every 6 (six) hours as needed for moderate pain.       Diet: routine diet  Activity: Advance as tolerated. Pelvic rest for 6 weeks.   Outpatient follow up:3 and 6 weeks Follow up Appt:No future appointments. Follow up Visit:No Follow-up on file.  Postpartum contraception: Undecided  Newborn Data: Live born female  Birth Weight: 7 lb 1.1 oz (3206 g) APGAR: 8, 9  Baby Feeding: Breast Disposition:home with  mother   03/15/2017 Cathrine Musterecilia W Marshell Dilauro, DO

## 2017-03-15 NOTE — Lactation Note (Signed)
This note was copied from a baby's chart. Lactation Consultation Note  Patient Name: Boy Bonner PunaKlarissa Guice ZOXWR'UToday's Date: 03/15/2017 Reason for consult: Follow-up assessment Mom giving lots of bottles but reports she does want to BF. LC reviewed basic teaching, supply/demand and advised Mom if she wants to provide breast milk for her baby she needs to start putting baby to breast with feedings before giving any bottles. Mom reports her breasts are starting to fill. Left breast some mild diffuse redness noted but not engorged at this point. Small superficial crack noted at base of left nipple when assisting Mom to latch baby. Reviewed with parents how to bring bottom lip down for more depth with latch. Reviewed positioning and supporting breast with feeding, breast massage.  Baby demonstrated few good suckling bursts, few noted swallows. Mom reports some mild tenderness with baby latched. Care for sore nipples reviewed, advised to apply EBM, comfort gels given with instructions. Advised Mom baby should be at breast 8-12 times in 24 hours and with feeding ques. If Mom decides to give bottle for a feeding without putting baby to breast, she will need to pump to encourage milk production and protect milk supply.  Stressed importance of removing milk from breast to prevent engorgement, prevent infection and protect milk supply since she feels her breast are filling. Engorgement care reviewed. Harmony Hand pump given to Mom with instructions on use and cleaning. Advised of OP services and support group. Encouraged to call for questions/concerns.   Maternal Data    Feeding Feeding Type: Breast Fed Length of feed: 15 min  LATCH Score/Interventions Latch: Grasps breast easily, tongue down, lips flanged, rhythmical sucking. Intervention(s): Adjust position;Assist with latch;Breast massage;Breast compression  Audible Swallowing: A few with stimulation  Type of Nipple: Everted at rest and after  stimulation  Comfort (Breast/Nipple): Filling, red/small blisters or bruises, mild/mod discomfort  Problem noted: Mild/Moderate discomfort;Cracked, bleeding, blisters, bruises Interventions  (Cracked/bleeding/bruising/blister): Expressed breast milk to nipple Interventions (Mild/moderate discomfort): Comfort gels  Hold (Positioning): Assistance needed to correctly position infant at breast and maintain latch. Intervention(s): Breastfeeding basics reviewed;Support Pillows;Position options;Skin to skin  LATCH Score: 7  Lactation Tools Discussed/Used Tools: Pump;Comfort gels Breast pump type: Manual   Consult Status Consult Status: Complete Date: 03/15/17 Follow-up type: In-patient    Alfred LevinsGranger, Rexine Gowens Ann 03/15/2017, 11:37 AM

## 2017-03-15 NOTE — Progress Notes (Signed)
[name] was referred for history of anxiety.  Referral is screened out by Clinical Social Worker because none of the following criteria appear to apply and there are no reports impacting the pregnancy or her transition to the postpartum period.  CSW does not deem it clinically necessary to further investigate at this time.   -History of anxiety during this pregnancy, or of post-partum depression. - Diagnosis of anxiety within last 3 years  - History of Anxiety due to pregnancy loss/loss of child or -[name]'s symptoms are currently being treated with medication and/or therapy.  CSW met with [name] at bedside to complete assessment for consult regarding Hx of anxiety. Upon this writers arrival, [name] noted she was dx several years ago and has not dealt with it since. CSW inquired about [name] having a crib/basinet for baby. [name] noted she has one. Please contact the Clinical Social Worker if needs arise or upon [name] request.    [name], MSW, LCSW-A Clinical Social Worker  [facility]  Office: [phone]   

## 2017-03-15 NOTE — Progress Notes (Signed)
Patient ID: [name], female   DOB: 04/04/1996, 21 y.o.   MRN: [id number] Pt doing well. Pain well controlled. Lochia mild. Voiding well. Bonding well with baby - breastfeeding. Denies HA, CP or SOB. Requests discharge to home VSS ABD - soft, ND EXT - no Homans  A/P: PPD # 1 s/p svd - stable         Discharge instructions reviewed         F/u in 3 and 6 weeks

## 2017-03-15 NOTE — Discharge Instructions (Signed)
Nothing in vagina for 6 weeks.  No sex, tampons, and douching.  Other instructions as in [facility] Discharge Booklet. °

## 2017-03-19 ENCOUNTER — Inpatient Hospital Stay (HOSPITAL_COMMUNITY): Admission: RE | Admit: 2017-03-19 | Payer: Medicaid Other | Source: Ambulatory Visit

## 2017-11-07 ENCOUNTER — Other Ambulatory Visit: Payer: Self-pay

## 2017-11-07 ENCOUNTER — Encounter (HOSPITAL_COMMUNITY): Payer: Self-pay | Admitting: Emergency Medicine

## 2017-11-07 ENCOUNTER — Ambulatory Visit (HOSPITAL_COMMUNITY)
Admission: EM | Admit: 2017-11-07 | Discharge: 2017-11-07 | Disposition: A | Discharge status: Home / Self Care | Payer: Medicaid Other | Attending: Family Medicine | Admitting: Family Medicine

## 2017-11-07 DIAGNOSIS — J22 Unspecified acute lower respiratory infection: Secondary | ICD-10-CM | POA: Diagnosis not present

## 2017-11-07 MED ORDER — PREDNISONE 20 MG PO TABS: 40.0000 mg | ORAL_TABLET | Freq: Every day | ORAL | 0 refills | Status: AC

## 2017-11-07 MED ORDER — AZITHROMYCIN 250 MG PO TABS: ORAL_TABLET | ORAL | 0 refills | Status: DC

## 2017-11-07 NOTE — ED Triage Notes (Signed)
Pt c/o cough and fever 101 since Monday. Pt taking robitussin and nyquil

## 2017-11-07 NOTE — Discharge Instructions (Signed)
Push fluids to ensure adequate hydration and keep secretions thin.  Tylenol and/or ibuprofen as needed for pain or fevers.  Continue with over the counter treatments as needed for symptoms. Complete course of steroids and antibiotics. If symptoms worsen or do not improve in the next week to return to be seen or to follow up with your PCP.

## 2017-11-07 NOTE — ED Provider Notes (Signed)
[facility]    CSN: [id number] Arrival date & time: 11/07/17  1156     History   Chief Complaint Chief Complaint  Patient presents with  . Cough    HPI [name] is a 22 y.o. female.   [name] presents with complaints of worsening cough, chest pain, body aches and congestion which started 1/21. She states she had fevers originally of 101 which have improved, but cough and pain has since worsened. Denise ear pain or sore throat. No known ill contacts. Without shortness of breath. No recent travel, does not smoke, no leg pain or swelling. No asthma history. Loose stools yesterday otherwise denies gi/gu complaints. Has been taking robitussin, tylenol and nyquil which have not helped. Tylenol last at 1000 today. Without rash.   ROS per HPI.       Past Medical History:  Diagnosis Date  . Abdominal pain   . Allergy   . Anxiety   . Constipation   . Diarrhea   . Headache(784.0)   . HSV (herpes simplex virus) anogenital infection   . Proctalgia   . Yeast infection     Patient Active Problem List   Diagnosis Date Noted  . SVD (spontaneous vaginal delivery) 03/14/2017  . Indication for care in labor or delivery 03/13/2017  . Nausea & vomiting 06/02/2014  . Anxiety 06/02/2014  . Nausea and vomiting 06/02/2014  . Intestinal bacterial overgrowth 08/03/2012  . Pyrosis 07/09/2012  . Generalized abdominal pain   . Appendicitis, acute 08/18/2011  . Constipation   . Proctalgia     Past Surgical History:  Procedure Laterality Date  . APPENDECTOMY  08/18/11  . APPENDECTOMY    . birth control implant in arm     Hx: of  . ESOPHAGOGASTRODUODENOSCOPY N/A 02/19/2013   Procedure: ESOPHAGOGASTRODUODENOSCOPY (EGD);  Surgeon: Jon Gills, MD;  [facility]: [facility];  Service: Gastroenterology;  Laterality: N/A;  . LAPAROSCOPIC APPENDECTOMY  08/18/2011   Procedure: APPENDECTOMY LAPAROSCOPIC;  Surgeon: Judie Petit. Leonia Corona, MD;  Location: [facility] OR;  Service: Pediatrics;;     OB History    Gravida Para Term Preterm AB Living   1 1 1  0 0 1   SAB TAB Ectopic Multiple Live Births   0 0 0 0 1       Home Medications    Prior to Admission medications   Medication Sig Start Date End Date Taking? Authorizing Provider  azithromycin (ZITHROMAX) 250 MG tablet Take first 2 tablets together, then 1 every day until finished. 11/07/17   Georgetta Haber, NP  ibuprofen (ADVIL,MOTRIN) 600 MG tablet Take 1 tablet (600 mg total) by mouth every 6 (six) hours as needed for moderate pain. 03/15/17   Banga, Sharol Given, DO  predniSONE (DELTASONE) 20 MG tablet Take 2 tablets (40 mg total) by mouth daily with breakfast for 5 days. 11/07/17 11/12/17  Georgetta Haber, NP    Family History Family History  Problem Relation Age of Onset  . Arthritis Maternal Grandmother   . Hearing loss Maternal Grandmother   . Diabetes Maternal Grandmother   . Cancer Maternal Grandfather   . Bipolar disorder Brother     Social History Social History   Tobacco Use  . Smoking status: Current Some Day Smoker  . Smokeless tobacco: Never Used  Substance Use Topics  . Alcohol use: No  . Drug use: Yes    Types: Marijuana    Comment: LAST TIME IN 2017     Allergies   Amoxicillin; Penicillins;  Phenergan [promethazine]; and Penicillins   Review of Systems Review of Systems   Physical Exam Triage Vital Signs ED Triage Vitals [11/07/17 1254]  Enc Vitals Group     BP (!) 134/91     Pulse Rate (!) 102     Resp 18     Temp 98.5 F (36.9 C)     Temp src      SpO2 100 %     Weight      Height      Head Circumference      Peak Flow      Pain Score 9     Pain Loc      Pain Edu?      Excl. in GC?    No data found.  Updated Vital Signs BP (!) 134/91   Pulse (!) 102   Temp 98.5 F (36.9 C)   Resp 18   LMP 10/17/2017   SpO2 100%   Visual Acuity Right Eye Distance:   Left Eye Distance:   Bilateral Distance:    Right Eye Near:   Left Eye Near:    Bilateral Near:      Physical Exam  Constitutional: She is oriented to person, place, and time. She appears well-developed and well-nourished. She appears ill. No distress.  HENT:  Head: Normocephalic and atraumatic.  Right Ear: Tympanic membrane, external ear and ear canal normal.  Left Ear: Tympanic membrane, external ear and ear canal normal.  Nose: Nose normal.  Mouth/Throat: Uvula is midline, oropharynx is clear and moist and mucous membranes are normal. No tonsillar exudate.  Eyes: Conjunctivae and EOM are normal. Pupils are equal, round, and reactive to light.  Cardiovascular: Regular rhythm and normal heart sounds. Tachycardia present.  Pulmonary/Chest: Effort normal and breath sounds normal.  Neurological: She is alert and oriented to person, place, and time.  Skin: Skin is warm and dry.  Afebrile but patient feels noticeably warm to touch      UC Treatments / Results  Labs (all labs ordered are listed, but only abnormal results are displayed) Labs Reviewed - No data to display  EKG  EKG Interpretation None       Radiology No results found.  Procedures Procedures (including critical care time)  Medications Ordered in UC Medications - No data to display   Initial Impression / Assessment and Plan / UC Course  I have reviewed the triage vital signs and the nursing notes.  Pertinent labs & imaging results that were available during my care of the patient were reviewed by me and considered in my medical decision making (see chart for details).     Mild tachycardia, fever controlled with tylenol at this time. Lungs clear, without shortness of breath. Deferred chest xray at this time, azithromycin and prednisone initiated based on history and physical. Complete course. Continue with supportive cares. Patient states she has similar illness annually. If symptoms worsen or do not improve in the next week to return to be seen or to follow up with PCP.  Patient verbalized understanding and  agreeable to plan.    Final Clinical Impressions(s) / UC Diagnoses   Final diagnoses:  Lower respiratory infection    ED Discharge Orders        Ordered    azithromycin (ZITHROMAX) 250 MG tablet     11/07/17 1303    predniSONE (DELTASONE) 20 MG tablet  Daily with breakfast     11/07/17 1303       Controlled Substance Prescriptions  [place] Controlled Substance Registry consulted? Not Applicable   Georgetta HaberBurky, Natalie B, NP 11/07/17 1309

## 2018-06-30 ENCOUNTER — Other Ambulatory Visit: Payer: Self-pay

## 2018-06-30 ENCOUNTER — Encounter (HOSPITAL_COMMUNITY): Payer: Self-pay | Admitting: Emergency Medicine

## 2018-06-30 ENCOUNTER — Ambulatory Visit (HOSPITAL_COMMUNITY)
Admission: EM | Admit: 2018-06-30 | Discharge: 2018-06-30 | Disposition: A | Discharge status: Home / Self Care | Payer: Medicaid Other | Attending: Family Medicine | Admitting: Family Medicine

## 2018-06-30 DIAGNOSIS — R110 Nausea: Secondary | ICD-10-CM | POA: Diagnosis not present

## 2018-06-30 DIAGNOSIS — R42 Dizziness and giddiness: Secondary | ICD-10-CM

## 2018-06-30 LAB — POCT URINALYSIS DIP (DEVICE)
Bilirubin Urine: NEGATIVE
Glucose, UA: NEGATIVE mg/dL
Ketones, ur: 80 mg/dL — AB
LEUKOCYTES UA: NEGATIVE
NITRITE: NEGATIVE
Protein, ur: NEGATIVE mg/dL
Specific Gravity, Urine: 1.02 (ref 1.005–1.030)
UROBILINOGEN UA: 0.2 mg/dL (ref 0.0–1.0)
pH: 7 (ref 5.0–8.0)

## 2018-06-30 LAB — POCT I-STAT, CHEM 8
BUN: 10 mg/dL (ref 6–20)
CALCIUM ION: 1.09 mmol/L — AB (ref 1.15–1.40)
CREATININE: 0.6 mg/dL (ref 0.44–1.00)
Chloride: 104 mmol/L (ref 98–111)
GLUCOSE: 105 mg/dL — AB (ref 70–99)
HEMATOCRIT: 41 % (ref 36.0–46.0)
HEMOGLOBIN: 13.9 g/dL (ref 12.0–15.0)
Potassium: 3.7 mmol/L (ref 3.5–5.1)
Sodium: 136 mmol/L (ref 135–145)
TCO2: 21 mmol/L — AB (ref 22–32)

## 2018-06-30 LAB — POCT PREGNANCY, URINE: Preg Test, Ur: NEGATIVE

## 2018-06-30 MED ORDER — ONDANSETRON 4 MG PO TBDP: 4.0000 mg | ORAL_TABLET | Freq: Three times a day (TID) | ORAL | 0 refills | Status: DC | PRN

## 2018-06-30 NOTE — ED Triage Notes (Addendum)
[facility] a [facility]  Dizziness and nausea x 2 weeks. The patient reported that last night she started vomiting. The patient did report that about 5 weeks ago she did lose her birth control patch and her cycle has been off. The patient reported that she is sexually active. Urine sample collected at triage.

## 2018-06-30 NOTE — ED Provider Notes (Signed)
[facility]    CSN: [id number] Arrival date & time: 06/30/18  1029     History   Chief Complaint Chief Complaint  Patient presents with  . Nausea    HPI [name] is a 22 y.o. female history of anxiety, cyclical vomiting presenting today for evaluation of dizziness and nausea.  Patient states that for the past 2 to 3 weeks she has been waking up feeling dizzy as if the room is spinning, this subsides, and she has persistent nauseousness throughout the day.  Yesterday evening she developed vomiting that persisted throughout the night.  She has had poor appetite over the past couple of days.  States that prior to her pregnancy she had issues with nausea and vomiting which she would require Zofran daily, this is similar, but has not had as much vomiting as she previously had.  Does note that she has had increased stress recently as her father has been diagnosed with cancer has been in the ICU.  Denies abdominal pain or changes in her bowel movements.  Denies associated URI symptoms.  Denies dysuria or increased frequency.  HPI  Past Medical History:  Diagnosis Date  . Abdominal pain   . Allergy   . Anxiety   . Constipation   . Diarrhea   . Headache(784.0)   . HSV (herpes simplex virus) anogenital infection   . Proctalgia   . Yeast infection     Patient Active Problem List   Diagnosis Date Noted  . SVD (spontaneous vaginal delivery) 03/14/2017  . Indication for care in labor or delivery 03/13/2017  . Nausea & vomiting 06/02/2014  . Anxiety 06/02/2014  . Nausea and vomiting 06/02/2014  . Intestinal bacterial overgrowth 08/03/2012  . Pyrosis 07/09/2012  . Generalized abdominal pain   . Appendicitis, acute 08/18/2011  . Constipation   . Proctalgia     Past Surgical History:  Procedure Laterality Date  . APPENDECTOMY  08/18/11  . APPENDECTOMY    . birth control implant in arm     Hx: of  . ESOPHAGOGASTRODUODENOSCOPY N/A 02/19/2013   Procedure:  ESOPHAGOGASTRODUODENOSCOPY (EGD);  Surgeon: Jon Gills, MD;  Location: [facility] OR;  Service: Gastroenterology;  Laterality: N/A;  . LAPAROSCOPIC APPENDECTOMY  08/18/2011   Procedure: APPENDECTOMY LAPAROSCOPIC;  Surgeon: Judie Petit. Leonia Corona, MD;  Location: [facility] OR;  Service: Pediatrics;;    OB History    Gravida  1   Para  1   Term  1   Preterm  0   AB  0   Living  1     SAB  0   TAB  0   Ectopic  0   Multiple  0   Live Births  1            Home Medications    Prior to Admission medications   Medication Sig Start Date End Date Taking? Authorizing Provider  ondansetron (ZOFRAN ODT) 4 MG disintegrating tablet Take 1 tablet (4 mg total) by mouth every 8 (eight) hours as needed for nausea or vomiting. 06/30/18   Wieters, Junius Creamer, PA-C    Family History Family History  Problem Relation Age of Onset  . Arthritis Maternal Grandmother   . Hearing loss Maternal Grandmother   . Diabetes Maternal Grandmother   . Cancer Maternal Grandfather   . Bipolar disorder Brother     Social History Social History   Tobacco Use  . Smoking status: Current Some Day Smoker  . Smokeless tobacco: Never Used  Substance Use Topics  . Alcohol use: No  . Drug use: Yes    Types: Marijuana    Comment: LAST TIME IN 2017     Allergies   Amoxicillin; Penicillins; Phenergan [promethazine]; and Penicillins   Review of Systems Review of Systems  Constitutional: Negative for activity change, appetite change, chills, fatigue and fever.  HENT: Negative for congestion, ear pain, rhinorrhea, sinus pressure, sore throat and trouble swallowing.   Eyes: Negative for discharge and redness.  Respiratory: Negative for cough, chest tightness and shortness of breath.   Cardiovascular: Negative for chest pain.  Gastrointestinal: Positive for nausea and vomiting. Negative for abdominal pain and diarrhea.  Genitourinary: Negative for dysuria, flank pain, genital sores, hematuria, menstrual problem,  vaginal bleeding, vaginal discharge and vaginal pain.  Musculoskeletal: Negative for back pain and myalgias.  Skin: Negative for rash.  Neurological: Positive for dizziness. Negative for weakness, light-headedness and headaches.     Physical Exam Triage Vital Signs ED Triage Vitals [06/30/18 1115]  Enc Vitals Group     BP 130/73     Pulse Rate 88     Resp 16     Temp 98.2 F (36.8 C)     Temp Source Oral     SpO2 100 %     Weight      Height      Head Circumference      Peak Flow      Pain Score 0     Pain Loc      Pain Edu?      Excl. in [place]?    Orthostatic VS for the past 24 hrs:  BP- Lying Pulse- Lying BP- Sitting Pulse- Sitting BP- Standing at 0 minutes  06/30/18 1214 123/69 84 121/77 82 116/69    Updated Vital Signs BP 130/73 (BP Location: Left Arm)   Pulse 88   Temp 98.2 F (36.8 C) (Oral)   Resp 16   LMP 05/30/2018   SpO2 100%   Visual Acuity Right Eye Distance:   Left Eye Distance:   Bilateral Distance:    Right Eye Near:   Left Eye Near:    Bilateral Near:     Physical Exam  Constitutional: She is oriented to person, place, and time. She appears well-developed and well-nourished. No distress.  HENT:  Head: Normocephalic and atraumatic.  Mouth/Throat: Oropharynx is clear and moist.  Oral mucosa pink and moist, no tonsillar enlargement or exudate. Posterior pharynx patent and nonerythematous, no uvula deviation or swelling. Normal phonation.  Eyes: Pupils are equal, round, and reactive to light. Conjunctivae and EOM are normal.  Neck: Neck supple.  Cardiovascular: Normal rate and regular rhythm.  No murmur heard. Pulmonary/Chest: Effort normal and breath sounds normal. No respiratory distress.  Breathing comfortably at rest, CTABL, no wheezing, rales or other adventitious sounds auscultated  Abdominal: Soft. There is no tenderness.  Musculoskeletal: She exhibits no edema.  Neurological: She is alert and oriented to person, place, and time.    Patient A&O x3, cranial nerves II-XII grossly intact, strength at shoulders, hips and knees 5/5, equal bilaterally, patellar reflex 2+ bilaterally.  Gait without abnormality.  Skin: Skin is warm and dry.  Psychiatric: She has a normal mood and affect.  Nursing note and vitals reviewed.    UC Treatments / Results  Labs (all labs ordered are listed, but only abnormal results are displayed) Labs Reviewed  POCT URINALYSIS DIP (DEVICE) - Abnormal; Notable for the following components:      Result  Value   Ketones, ur 80 (*)    Hgb urine dipstick TRACE (*)    All other components within normal limits  POCT I-STAT, CHEM 8 - Abnormal; Notable for the following components:   Glucose, Bld 105 (*)    Calcium, Ion 1.09 (*)    TCO2 21 (*)    All other components within normal limits  POCT PREGNANCY, URINE    EKG None  Radiology No results found.  Procedures Procedures (including critical care time)  Medications Ordered in UC Medications - No data to display  Initial Impression / Assessment and Plan / UC Course  I have reviewed the triage vital signs and the nursing notes.  Pertinent labs & imaging results that were available during my care of the patient were reviewed by me and considered in my medical decision making (see chart for details).     Patient with 3 weeks worth of nausea and associated dizziness.  States similar to prior episodes that she used to have.  Abdominal exam unremarkable, vital signs stable, i-STAT relatively normal, calcium is slightly low.  Recommended vitamin D3 and increase dietary calcium.  Will provide Zofran to treat nausea, establish care with PCP for further evaluation work-up.  Possibly related to anxiety/stress given her current situation.Discussed strict return precautions. Patient verbalized understanding and is agreeable with plan.  Final Clinical Impressions(s) / UC Diagnoses   Final diagnoses:  Nausea  Dizziness     Discharge Instructions      Your lab work looked normal, your calcium was slightly low, please begin taking vitamin D3 tablet as well as trying to incorporate more calcium into your diet  Please use Zofran as needed for nausea and vomiting, this tablet dissolved underneath your tongue  Please try to drink plenty of fluids in order to stay hydrated, begin with a bland diet and slowly transition to your normal diet  If nausea vomiting persisting, please follow-up with primary care if worsening, developing abdominal pain, fever, worsening symptoms please follow-up here in emergency room.   ED Prescriptions    Medication Sig Dispense Auth. Provider   ondansetron (ZOFRAN ODT) 4 MG disintegrating tablet Take 1 tablet (4 mg total) by mouth every 8 (eight) hours as needed for nausea or vomiting. 30 tablet Wieters, [place] C, PA-C     Controlled Substance Prescriptions [place] Controlled Substance Registry consulted? Not Applicable   Lew Dawes, [place] 06/30/18 1559

## 2018-06-30 NOTE — Discharge Instructions (Signed)
Your lab work looked normal, your calcium was slightly low, please begin taking vitamin D3 tablet as well as trying to incorporate more calcium into your diet  Please use Zofran as needed for nausea and vomiting, this tablet dissolved underneath your tongue  Please try to drink plenty of fluids in order to stay hydrated, begin with a bland diet and slowly transition to your normal diet  If nausea vomiting persisting, please follow-up with primary care if worsening, developing abdominal pain, fever, worsening symptoms please follow-up here in emergency room.

## 2018-09-06 ENCOUNTER — Encounter: Payer: Self-pay | Admitting: Emergency Medicine

## 2018-09-06 ENCOUNTER — Emergency Department
Admission: EM | Admit: 2018-09-06 | Discharge: 2018-09-06 | Disposition: A | Discharge status: Home / Self Care | Payer: Medicaid Other | Attending: Emergency Medicine | Admitting: Emergency Medicine

## 2018-09-06 DIAGNOSIS — R070 Pain in throat: Secondary | ICD-10-CM | POA: Diagnosis present

## 2018-09-06 DIAGNOSIS — R509 Fever, unspecified: Secondary | ICD-10-CM | POA: Diagnosis not present

## 2018-09-06 DIAGNOSIS — F17200 Nicotine dependence, unspecified, uncomplicated: Secondary | ICD-10-CM | POA: Diagnosis not present

## 2018-09-06 DIAGNOSIS — J020 Streptococcal pharyngitis: Secondary | ICD-10-CM | POA: Insufficient documentation

## 2018-09-06 LAB — CBC WITH DIFFERENTIAL/PLATELET
ABS IMMATURE GRANULOCYTES: 0.07 10*3/uL (ref 0.00–0.07)
Basophils Absolute: 0.1 10*3/uL (ref 0.0–0.1)
Basophils Relative: 0 %
EOS ABS: 0 10*3/uL (ref 0.0–0.5)
Eosinophils Relative: 0 %
HEMATOCRIT: 41.1 % (ref 36.0–46.0)
Hemoglobin: 13.8 g/dL (ref 12.0–15.0)
Immature Granulocytes: 1 %
Lymphocytes Relative: 12 %
Lymphs Abs: 1.7 10*3/uL (ref 0.7–4.0)
MCH: 29.1 pg (ref 26.0–34.0)
MCHC: 33.6 g/dL (ref 30.0–36.0)
MCV: 86.5 fL (ref 80.0–100.0)
MONO ABS: 0.7 10*3/uL (ref 0.1–1.0)
MONOS PCT: 5 %
NEUTROS ABS: 11.1 10*3/uL — AB (ref 1.7–7.7)
NRBC: 0 % (ref 0.0–0.2)
Neutrophils Relative %: 82 %
Platelets: 234 10*3/uL (ref 150–400)
RBC: 4.75 MIL/uL (ref 3.87–5.11)
RDW: 13.2 % (ref 11.5–15.5)
WBC: 13.7 10*3/uL — ABNORMAL HIGH (ref 4.0–10.5)

## 2018-09-06 LAB — BASIC METABOLIC PANEL
ANION GAP: 11 (ref 5–15)
BUN: 12 mg/dL (ref 6–20)
CHLORIDE: 104 mmol/L (ref 98–111)
CO2: 22 mmol/L (ref 22–32)
Calcium: 9.3 mg/dL (ref 8.9–10.3)
Creatinine, Ser: 0.58 mg/dL (ref 0.44–1.00)
GFR calc Af Amer: >60 mL/min (ref >60)
GFR calc non Af Amer: >60 mL/min (ref >60)
GLUCOSE: 113 mg/dL — AB (ref 70–99)
Potassium: 3.8 mmol/L (ref 3.5–5.1)
Sodium: 137 mmol/L (ref 135–145)

## 2018-09-06 LAB — GROUP A STREP BY PCR: GROUP A STREP BY PCR: DETECTED — AB

## 2018-09-06 LAB — MONONUCLEOSIS SCREEN: Mono Screen: NEGATIVE

## 2018-09-06 MED ORDER — CLINDAMYCIN PHOSPHATE 600 MG/50ML IV SOLN
600.0000 mg | Freq: Once | INTRAVENOUS | Status: AC
  Administered 2018-09-06: 600 mg via INTRAVENOUS
  Filled 2018-09-06: qty 50

## 2018-09-06 MED ORDER — SODIUM CHLORIDE 0.9 % IV BOLUS
1000.0000 mL | Freq: Once | INTRAVENOUS | Status: AC
  Administered 2018-09-06: 1000 mL via INTRAVENOUS

## 2018-09-06 MED ORDER — AZITHROMYCIN 250 MG PO TABS: ORAL_TABLET | ORAL | 0 refills | Status: DC

## 2018-09-06 MED ORDER — HYDROCODONE-ACETAMINOPHEN 5-325 MG PO TABS: 1.0000 | ORAL_TABLET | Freq: Four times a day (QID) | ORAL | 0 refills | Status: DC | PRN

## 2018-09-06 MED ORDER — PREDNISONE 10 MG (21) PO TBPK: ORAL_TABLET | ORAL | 0 refills | Status: DC

## 2018-09-06 MED ORDER — METHYLPREDNISOLONE SODIUM SUCC 125 MG IJ SOLR
125.0000 mg | Freq: Once | INTRAMUSCULAR | Status: AC
  Administered 2018-09-06: 125 mg via INTRAVENOUS
  Filled 2018-09-06: qty 2

## 2018-09-06 NOTE — ED Triage Notes (Signed)
Pt reports sore throat, swollen tonsils, earache in both ears and intermittent fevers for the last 4 days.

## 2018-09-06 NOTE — ED Notes (Addendum)
See triage note  Presents with a 4 day hx of sore throat,body aches and fever  Afebrile on arrival but states fever was 102 at home  Speech is slightly muffled on arrival  conts to have increased pain with swallowing  States she has had decreased PO intake d/t pain

## 2018-09-06 NOTE — Discharge Instructions (Addendum)
Follow-up with [facility] if not better to 3 days.  If worsening please return the emergency department.  Take medications as prescribed.  He will start your antibiotic tomorrow.  Continue to gargle with salt water.  Take ibuprofen or Tylenol as needed for fever.

## 2018-09-06 NOTE — ED Provider Notes (Signed)
[facility] Emergency Department Provider Note  ____________________________________________   First MD Initiated Contact with Patient 09/06/18 1102     (approximate)  I have reviewed the triage vital signs and the nursing notes.   HISTORY  Chief Complaint Sore Throat; Otalgia; and Fever    HPI [name] is a 22 y.o. female presents emergency department complaining of sore throat, fever, and body aches for 4 days.  She states the throat is very painful and she is been unable to drink or eat.  She denies any vomiting or diarrhea.  No exposures to strep that she is aware of.  She denies any chance of this being a STD.    Past Medical History:  Diagnosis Date  . Abdominal pain   . Allergy   . Anxiety   . Constipation   . Diarrhea   . Headache(784.0)   . HSV (herpes simplex virus) anogenital infection   . Proctalgia   . Yeast infection     Patient Active Problem List   Diagnosis Date Noted  . SVD (spontaneous vaginal delivery) 03/14/2017  . Indication for care in labor or delivery 03/13/2017  . Nausea & vomiting 06/02/2014  . Anxiety 06/02/2014  . Nausea and vomiting 06/02/2014  . Intestinal bacterial overgrowth 08/03/2012  . Pyrosis 07/09/2012  . Generalized abdominal pain   . Appendicitis, acute 08/18/2011  . Constipation   . Proctalgia     Past Surgical History:  Procedure Laterality Date  . APPENDECTOMY  08/18/11  . APPENDECTOMY    . birth control implant in arm     Hx: of  . ESOPHAGOGASTRODUODENOSCOPY N/A 02/19/2013   Procedure: ESOPHAGOGASTRODUODENOSCOPY (EGD);  Surgeon: Oletha Blend, MD;  Location: [place];  Service: Gastroenterology;  Laterality: N/A;  . LAPAROSCOPIC APPENDECTOMY  08/18/2011   Procedure: APPENDECTOMY LAPAROSCOPIC;  Surgeon: Jerilynn Mages. Gerald Stabs, MD;  Location: [place];  Service: Pediatrics;;    Prior to Admission medications   Medication Sig Start Date End Date Taking? Authorizing Provider  azithromycin  (ZITHROMAX Z-PAK) 250 MG tablet 2 pills today then 1 pill a day for 4 days 09/06/18   Caryn Section Linden Dolin, PA-C  HYDROcodone-acetaminophen (NORCO/VICODIN) 5-325 MG tablet Take 1 tablet by mouth every 6 (six) hours as needed for moderate pain. 09/06/18   Ashlyne Olenick, Linden Dolin, PA-C  predniSONE (STERAPRED UNI-PAK 21 TAB) 10 MG (21) TBPK tablet Take 6 pills on day one then decrease by 1 pill each day 09/06/18   Versie Starks, PA-C    Allergies Amoxicillin; Penicillins; Phenergan [promethazine]; and Penicillins  Family History  Problem Relation Age of Onset  . Arthritis Maternal Grandmother   . Hearing loss Maternal Grandmother   . Diabetes Maternal Grandmother   . Cancer Maternal Grandfather   . Bipolar disorder Brother     Social History Social History   Tobacco Use  . Smoking status: Current Some Day Smoker  . Smokeless tobacco: Never Used  Substance Use Topics  . Alcohol use: No  . Drug use: Yes    Types: Marijuana    Comment: LAST TIME IN 2017    Review of Systems  Constitutional: Positive fever/chills Eyes: No visual changes. ENT: Positive sore throat. Respiratory: Denies cough Genitourinary: Negative for dysuria. Musculoskeletal: Negative for back pain. Skin: Negative for rash.    ____________________________________________   PHYSICAL EXAM:  VITAL SIGNS: ED Triage Vitals  Enc Vitals Group     BP 09/06/18 1049 (!) 142/86     Pulse Rate 09/06/18 1049 (!)  113     Resp 09/06/18 1049 18     Temp 09/06/18 1049 98.2 F (36.8 C)     Temp Source 09/06/18 1049 Oral     SpO2 09/06/18 1049 95 %     Weight 09/06/18 1046 180 lb (81.6 kg)     Height 09/06/18 1046 _0  (1.651 m)     Head Circumference --      Peak Flow --      Pain Score 09/06/18 1046 10     Pain Loc --      Pain Edu? --      Excl. in [place]? --     Constitutional: Alert and oriented. Well appearing and in no acute distress. Eyes: Conjunctivae are normal.  Head: Atraumatic. Nose: No  congestion/rhinnorhea. Mouth/Throat: Mucous membranes are moist.  Throat is grossly swollen, tonsils are almost kissing, large amount of exudate posteriorly.  Patient has dysphonia. Neck:  supple no lymphadenopathy noted Cardiovascular: Normal rate, regular rhythm. Heart sounds are normal Respiratory: Normal respiratory effort.  No retractions, lungs c t a  GU: deferred Musculoskeletal: FROM all extremities, warm and well perfused Neurologic:  Normal speech and language.  Skin:  Skin is warm, dry and intact. No rash noted. Psychiatric: Mood and affect are normal. Speech and behavior are normal.  ____________________________________________   LABS (all labs ordered are listed, but only abnormal results are displayed)  Labs Reviewed  GROUP A STREP BY PCR - Abnormal; Notable for the following components:      Result Value   Group A Strep by PCR DETECTED (*)    All other components within normal limits  CBC WITH DIFFERENTIAL/PLATELET - Abnormal; Notable for the following components:   WBC 13.7 (*)    Neutro Abs 11.1 (*)    All other components within normal limits  BASIC METABOLIC PANEL - Abnormal; Notable for the following components:   Glucose, Bld 113 (*)    All other components within normal limits  MONONUCLEOSIS SCREEN   ____________________________________________   ____________________________________________  RADIOLOGY    ____________________________________________   PROCEDURES  Procedure(s) performed: Saline lock, 1 L normal saline IV, clindamycin 600 mg IV, Solu-Medrol 125 mg IV  Procedures    ____________________________________________   INITIAL IMPRESSION / ASSESSMENT AND PLAN / ED COURSE  Pertinent labs & imaging results that were available during my care of the patient were reviewed by me and considered in my medical decision making (see chart for details).   Patient is a 22 year old female presents emergency department with sore throat for 4  days.  Physical exam shows a grossly red swollen throat with large amount of exudate.  Patient has dysphonia.  Slight amount of trismus.  Strep test ordered, mono test ordered, CBC, met B.  Saline lock, normal saline 1 L IV, Solu-Medrol 125 mg IV.    ----------------------------------------- 12:11 PM on 09/06/2018 -----------------------------------------  Strep test is positive, mono test negative, CBC has elevated WBC of 29.7, metabolic panel is normal.  Explained test results to the patient.  She was given IV clindamycin as she is allergic to amoxicillin.  She was given a prescription for Z-Pak, steroid pack, and Vicodin No. 15 no refill.  She is to continue to gargle with warm salt water.  Return emergency department worsening.  Follow-up with [facility] if not better to 3 days.  States she understands will comply.  She is discharged stable condition.  As part of my medical decision making, I reviewed the following data within the  electronic MEDICAL RECORD NUMBER Nursing notes reviewed and incorporated, Labs reviewed strep test positive, mono test negative, CBC with elevated WBC of 95.1, metabolic panel is normal, Old chart reviewed, Notes from prior ED visits and [place] Controlled Substance Database  ____________________________________________   FINAL CLINICAL IMPRESSION(S) / ED DIAGNOSES  Final diagnoses:  Acute streptococcal pharyngitis      NEW MEDICATIONS STARTED DURING THIS VISIT:  New Prescriptions   AZITHROMYCIN (ZITHROMAX Z-PAK) 250 MG TABLET    2 pills today then 1 pill a day for 4 days   HYDROCODONE-ACETAMINOPHEN (NORCO/VICODIN) 5-325 MG TABLET    Take 1 tablet by mouth every 6 (six) hours as needed for moderate pain.   PREDNISONE (STERAPRED UNI-PAK 21 TAB) 10 MG (21) TBPK TABLET    Take 6 pills on day one then decrease by 1 pill each day     Note:  This document was prepared using Dragon voice recognition software and may include unintentional dictation errors.     Versie Starks, PA-C 09/06/18 1214    Lisa Roca, MD 09/06/18 [phone]

## 2019-07-29 ENCOUNTER — Encounter

## 2019-07-29 ENCOUNTER — Telehealth: Payer: Self-pay | Admitting: Neurology

## 2019-07-29 ENCOUNTER — Other Ambulatory Visit: Payer: Self-pay

## 2019-07-29 ENCOUNTER — Ambulatory Visit: Payer: Medicaid Other | Admitting: Neurology

## 2019-07-29 ENCOUNTER — Encounter: Payer: Self-pay | Admitting: Neurology

## 2019-07-29 VITALS — BP 115/68 | HR 89 | Temp 97.9°F | Ht 65.0 in | Wt 185.0 lb

## 2019-07-29 DIAGNOSIS — G4440 Drug-induced headache, not elsewhere classified, not intractable: Secondary | ICD-10-CM

## 2019-07-29 DIAGNOSIS — Z79899 Other long term (current) drug therapy: Secondary | ICD-10-CM | POA: Diagnosis not present

## 2019-07-29 DIAGNOSIS — G43711 Chronic migraine without aura, intractable, with status migrainosus: Secondary | ICD-10-CM

## 2019-07-29 MED ORDER — AJOVY 225 MG/1.5ML ~~LOC~~ SOAJ: 225.0000 mg | SUBCUTANEOUS | 11 refills | Status: DC

## 2019-07-29 NOTE — Telephone Encounter (Signed)
medicaid order sent to [facility]. They will reach out to the patient to schedule  °

## 2019-07-29 NOTE — Progress Notes (Signed)
[facility]    Provider:  Dr Lucia Gaskins Requesting Provider: Georgana Curio Primary Care Provider:  Mendel Ryder, PA-C  CC:  Migraines  HPI:  [name] is a 23 y.o. female here as requested by Mendel Ryder, PA-C for Migraines.   MIgraines for years. She has been "dealing with it".  a day of ibuprofen. Every day. Headaches are worsening. She is having headaches every single day. She wakes up with it in the morning. Worse positionally. She gets blurred vision. Headaches are severe. All day long continuous. They are pounding and throbbing in the back of the head. Also cam be in the front. It is all over the head, he has hearing changes she can't hear, dizziness She takes 40 imitrex. Insurance not paying for it. No other focal neurologic deficits, associated symptoms, inciting events or modifiable factors.  Review of Systems: Patient complains of symptoms per HPI as well as the following symptoms: migraines. Pertinent negatives and positives per HPI. All others negative.   Social History   Socioeconomic History  . Marital status: Single    Spouse name: Not on file  . Number of children: Not on file  . Years of education: Not on file  . Highest education level: Not on file  Occupational History  . Not on file  Social Needs  . Financial resource strain: Not on file  . Food insecurity    Worry: Not on file    Inability: Not on file  . Transportation needs    Medical: Not on file    Non-medical: Not on file  Tobacco Use  . Smoking status: Never Smoker  . Smokeless tobacco: Never Used  Substance and Sexual Activity  . Alcohol use: Yes    Comment: occasionally  . Drug use: Not Currently    Types: Marijuana    Comment: LAST TIME IN 2017  . Sexual activity: Yes    Birth control/protection: I.U.D., Condom  Lifestyle  . Physical activity    Days per week: Not on file    Minutes per session: Not on file  . Stress: Not on file  Relationships  .  Social Musician on phone: Not on file    Gets together: Not on file    Attends religious service: Not on file    Active member of club or organization: Not on file    Attends meetings of clubs or organizations: Not on file    Relationship status: Not on file  . Intimate partner violence    Fear of current or ex partner: Not on file    Emotionally abused: Not on file    Physically abused: Not on file    Forced sexual activity: Not on file  Other Topics Concern  . Not on file  Social History Narrative   Lives alone   Caffeine; tea 3x week         ** Merged History Encounter **       ** Merged History Encounter **       ** Data from: 04/02/14 Enc Dept: [facility] DE[facility]   10th grade       ** Data from: 06/02/14 Enc Dept: [facility]   Lives at home with mother, brother and MGM. Spends some time at step father's house. Secondary tobacco exposure. Pt admits to marijuana use, history of alcohol use, no other drug use or tobacco use.     Family History  Problem Relation Age of Onset  . Arthritis  Maternal Grandmother   . Hearing loss Maternal Grandmother   . Diabetes Maternal Grandmother   . Cancer Maternal Grandfather   . Bipolar disorder Brother   . Cancer Father   . Migraines Neg Hx     Past Medical History:  Diagnosis Date  . Abdominal pain   . Allergy   . Anxiety   . Constipation   . Diarrhea   . Headache(784.0)   . HSV (herpes simplex virus) anogenital infection   . Proctalgia   . Yeast infection     Patient Active Problem List   Diagnosis Date Noted  . SVD (spontaneous vaginal delivery) 03/14/2017  . Indication for care in labor or delivery 03/13/2017  . Nausea & vomiting 06/02/2014  . Anxiety 06/02/2014  . Nausea and vomiting 06/02/2014  . Intestinal bacterial overgrowth 08/03/2012  . Pyrosis 07/09/2012  . Generalized abdominal pain   . Appendicitis, acute 08/18/2011  . Constipation   . Proctalgia     Past Surgical History:   Procedure Laterality Date  . APPENDECTOMY  08/18/11  . APPENDECTOMY    . birth control implant in arm     Hx: of  . ESOPHAGOGASTRODUODENOSCOPY N/A 02/19/2013   Procedure: ESOPHAGOGASTRODUODENOSCOPY (EGD);  Surgeon: Oletha Blend, MD;  Location: [place];  Service: Gastroenterology;  Laterality: N/A;  . LAPAROSCOPIC APPENDECTOMY  08/18/2011   Procedure: APPENDECTOMY LAPAROSCOPIC;  Surgeon: Jerilynn Mages. Gerald Stabs, MD;  Location: [facility];  Service: Pediatrics;;    Current Outpatient Medications  Medication Sig Dispense Refill  . busPIRone (BUSPAR) 5 MG tablet Take 5 mg by mouth 2 (two) times daily.    . SUMAtriptan (IMITREX) 50 MG tablet TAKE 1 TABLET AS NEEDED FOR MIGRAINE. CAN REPEAT IN 2 HOURS IF NO RELIEF. DO NOT EXCEED 4 TABS/DAY    . azithromycin (ZITHROMAX Z-PAK) 250 MG tablet 2 pills today then 1 pill a day for 4 days (Patient not taking: Reported on 07/29/2019) 6 each 0  . Fremanezumab-vfrm (AJOVY) 225 MG/1.5ML SOAJ Inject 225 mg into the skin every 30 (thirty) days. 2 pen 11  . HYDROcodone-acetaminophen (NORCO/VICODIN) 5-325 MG tablet Take 1 tablet by mouth every 6 (six) hours as needed for moderate pain. (Patient not taking: Reported on 07/29/2019) 15 tablet 0  . predniSONE (STERAPRED UNI-PAK 21 TAB) 10 MG (21) TBPK tablet Take 6 pills on day one then decrease by 1 pill each day (Patient not taking: Reported on 07/29/2019) 21 tablet 0   No current facility-administered medications for this visit.     Allergies as of 07/29/2019 - Review Complete 07/29/2019  Allergen Reaction Noted  . Amoxicillin Hives 08/18/2011  . Penicillins Hives 02/05/2011  . Phenergan [promethazine] Nausea And Vomiting and Other (See Comments) 11/18/2015  . Penicillins  11/23/2015    Vitals: BP 115/68 (BP Location: Right Arm, Patient Position: Sitting)   Pulse 89   Temp 97.9 F (36.6 C)   Ht 5\' 5"  (1.651 m)   Wt 185 lb (83.9 kg)   BMI 30.79 kg/m  Last Weight:  Wt Readings from Last 1 Encounters:  07/29/19  185 lb (83.9 kg)   Last Height:   Ht Readings from Last 1 Encounters:  07/29/19 5\' 5"  (1.651 m)     Physical exam: Exam: Gen: NAD, conversant, well nourised, obese, well groomed                     CV: RRR, no MRG. No Carotid Bruits. No peripheral edema, warm, nontender Eyes: Conjunctivae clear without  exudates or hemorrhage  Neuro: Detailed Neurologic Exam  Speech:    Speech is normal; fluent and spontaneous with normal comprehension.  Cognition:    The patient is oriented to person, place, and time;     recent and remote memory intact;     language fluent;     normal attention, concentration,     fund of knowledge Cranial Nerves:    The pupils are equal, round, and reactive to light. The fundi are normal and spontaneous venous pulsations are present. Visual fields are full to finger confrontation. Extraocular movements are intact. Trigeminal sensation is intact and the muscles of mastication are normal. The face is symmetric. The palate elevates in the midline. Hearing intact. Voice is normal. Shoulder shrug is normal. The tongue has normal motion without fasciculations.   Coordination:    Normal finger to nose and heel to shin. Normal rapid alternating movements.   Gait:    Heel-toe and tandem gait are normal.   Motor Observation:    No asymmetry, no atrophy, and no involuntary movements noted. Tone:    Normal muscle tone.    Posture:    Posture is normal. normal erect    Strength:    Strength is V/V in the upper and lower limbs.      Sensation: intact to LT     Reflex Exam:  DTR's:    Deep tendon reflexes in the upper and lower extremities are normal bilaterally.   Toes:    The toes are downgoing bilaterally.   Clonus:    Clonus is absent.    Assessment/Plan:  Chronic daily headaches due to chronic migraines and medication overuse  I had a long discussion with patient that her daily use of ibuprofen or Tylenol can cause medication overuse/rebound headache  which is likely causing her chronic daily headaches. They only thing to do is to stop the medication unfortunately. In the timeframe after stopping at her headaches may get much worse. I will give her some medication to try to bridge that. She should significantly  improve with her chronic daily headaches after 2-4 weeks of being off her daily over-the-counter medications. Do not use these medications more than 2 times in a week.Discussed organ damage and risk of bleeding.  Migraines: tried topamax. Nortriptyline. Discussed migraine management including acute management and preventative management.  Meds ordered this encounter  Medications  . Fremanezumab-vfrm (AJOVY) 225 MG/1.5ML SOAJ    Sig: Inject 225 mg into the skin every 30 (thirty) days.    Dispense:  2 pen    Refill:  11     Remember to drink plenty of fluid, eat healthy meals and do not skip any meals. Try to eat protein with a every meal and eat a healthy snack such as fruit or nuts in between meals. Try to keep a regular sleep-wake schedule and try to exercise daily, particularly in the form of walking, 20-30 minutes a day, if you can.   As far as your medications are concerned, I would like to suggest: - Stop daily over the counter med use (Tylenol, excedrin, ibuprofen, alleve) Do not tak emore than 2-3x in one week -At onset of Migraine Imitrex. Please take one tablet at the onset of your headache. If it does not improve the symptoms please take one additional tablet. Do not take more then 2 tablets in 24hrs. Do not take use more then 2 to 3 days in a week. - MRI brain: MRI brain due to concerning symptoms  of morning headaches, positional headaches,vision changes, hearing loss, thunderclap quality, occipital location  to look for space occupying mass, chiari or intracranial hypertension (pseudotumor).  Orders Placed This Encounter  Procedures  . MR BRAIN W WO CONTRAST  . Comprehensive metabolic panel  . CBC with  Differential/Platelets  . TSH  . Pregnancy, urine     Discussed; To prevent or relieve headaches, try the following:  Cool Compress. Lie down and place a cool compress on your head.   Avoid headache triggers. If certain foods or odors seem to have triggered your migraines in the past, avoid them. A headache diary might help you identify triggers.   Include physical activity in your daily routine. Try a daily walk or other moderate aerobic exercise.   Manage stress. Find healthy ways to cope with the stressors, such as delegating tasks on your to-do list.   Practice relaxation techniques. Try deep breathing, yoga, massage and visualization.   Eat regularly. Eating regularly scheduled meals and maintaining a healthy diet might help prevent headaches. Also, drink plenty of fluids.   Follow a regular sleep schedule. Sleep deprivation might contribute to headaches  Consider biofeedback. With this mind-body technique, you learn to control certain bodily functions - such as muscle tension, heart rate and blood pressure - to prevent headaches or reduce headache pain.    Proceed to emergency room if you experience new or worsening symptoms or symptoms do not resolve, if you have new neurologic symptoms or if headache is severe, or for any concerning symptom.    Cc: Mendel Ryderrias, Jacob, PA-C,    A total of 60 minutes was spent face-to-face with this patient. Over half this time was spent on counseling patient on the  1. Chronic migraine without aura, with intractable migraine, so stated, with status migrainosus   2. Long term use of drug   3. Medication overuse headache    diagnosis and different diagnostic and therapeutic options, counseling and coordination of care, risks ans benefits of management, compliance, or risk factor reduction and education.     Naomie DeanAntonia Ahern, MD  [facility] [address] [place], [place] [id number]  Phone [phone] Fax  [phone]

## 2019-07-29 NOTE — Patient Instructions (Signed)
  Fremanezumab injection What is this medicine? FREMANEZUMAB (fre ma NEZ ue mab) is used to prevent migraine headaches. This medicine may be used for other purposes; ask your health care provider or pharmacist if you have questions. COMMON BRAND NAME(S): AJOVY What should I tell my health care provider before I take this medicine? They need to know if you have any of these conditions:  an unusual or allergic reaction to fremanezumab, other medicines, foods, dyes, or preservatives  pregnant or trying to get pregnant  breast-feeding How should I use this medicine? This medicine is for injection under the skin. You will be taught how to prepare and give this medicine. Use exactly as directed. Take your medicine at regular intervals. Do not take your medicine more often than directed. It is important that you put your used needles and syringes in a special sharps container. Do not put them in a trash can. If you do not have a sharps container, call your pharmacist or healthcare provider to get one. Talk to your pediatrician regarding the use of this medicine in children. Special care may be needed. Overdosage: If you think you have taken too much of this medicine contact a poison control center or emergency room at once. NOTE: This medicine is only for you. Do not share this medicine with others. What if I miss a dose? If you miss a dose, take it as soon as you can. If it is almost time for your next dose, take only that dose. Do not take double or extra doses. What may interact with this medicine? Interactions are not expected. This list may not describe all possible interactions. Give your health care provider a list of all the medicines, herbs, non-prescription drugs, or dietary supplements you use. Also tell them if you smoke, drink alcohol, or use illegal drugs. Some items may interact with your medicine. What should I watch for while using this medicine? Tell your doctor or healthcare  professional if your symptoms do not start to get better or if they get worse. What side effects may I notice from receiving this medicine? Side effects that you should report to your doctor or health care professional as soon as possible:  allergic reactions like skin rash, itching or hives, swelling of the face, lips, or tongue Side effects that usually do not require medical attention (report these to your doctor or health care professional if they continue or are bothersome):  pain, redness, or irritation at site where injected This list may not describe all possible side effects. Call your doctor for medical advice about side effects. You may report side effects to FDA at 1-800-FDA-1088. Where should I keep my medicine? Keep out of the reach of children. You will be instructed on how to store this medicine. Throw away any unused medicine after the expiration date on the label. NOTE: This sheet is a summary. It may not cover all possible information. If you have questions about this medicine, talk to your doctor, pharmacist, or health care provider.  2020 Elsevier/Gold Standard (2017-06-30 17:22:56)  

## 2019-07-30 LAB — CBC WITH DIFFERENTIAL/PLATELET
Basophils Absolute: 0.1 10*3/uL (ref 0.0–0.2)
Basos: 1 %
EOS (ABSOLUTE): 0.1 10*3/uL (ref 0.0–0.4)
Eos: 1 %
Hematocrit: 45.2 % (ref 34.0–46.6)
Hemoglobin: 15.3 g/dL (ref 11.1–15.9)
Immature Grans (Abs): 0 10*3/uL (ref 0.0–0.1)
Immature Granulocytes: 0 %
Lymphocytes Absolute: 2.8 10*3/uL (ref 0.7–3.1)
Lymphs: 25 %
MCH: 30.8 pg (ref 26.6–33.0)
MCHC: 33.8 g/dL (ref 31.5–35.7)
MCV: 91 fL (ref 79–97)
Monocytes Absolute: 0.7 10*3/uL (ref 0.1–0.9)
Monocytes: 6 %
Neutrophils Absolute: 7.3 10*3/uL — ABNORMAL HIGH (ref 1.4–7.0)
Neutrophils: 67 %
Platelets: 280 10*3/uL (ref 150–450)
RBC: 4.96 x10E6/uL (ref 3.77–5.28)
RDW: 12.8 % (ref 11.7–15.4)
WBC: 11 10*3/uL — ABNORMAL HIGH (ref 3.4–10.8)

## 2019-07-30 LAB — COMPREHENSIVE METABOLIC PANEL
ALT: 13 IU/L (ref 0–32)
AST: 14 IU/L (ref 0–40)
Albumin/Globulin Ratio: 2 (ref 1.2–2.2)
Albumin: 4.6 g/dL (ref 3.9–5.0)
Alkaline Phosphatase: 99 IU/L (ref 39–117)
BUN/Creatinine Ratio: 16 (ref 9–23)
BUN: 12 mg/dL (ref 6–20)
Bilirubin Total: 0.3 mg/dL (ref 0.0–1.2)
CO2: 25 mmol/L (ref 20–29)
Calcium: 9.6 mg/dL (ref 8.7–10.2)
Chloride: 100 mmol/L (ref 96–106)
Creatinine, Ser: 0.76 mg/dL (ref 0.57–1.00)
GFR calc Af Amer: 129 mL/min/{1.73_m2} (ref >59)
GFR calc non Af Amer: 112 mL/min/{1.73_m2} (ref >59)
Globulin, Total: 2.3 g/dL (ref 1.5–4.5)
Glucose: 87 mg/dL (ref 65–99)
Potassium: 4 mmol/L (ref 3.5–5.2)
Sodium: 138 mmol/L (ref 134–144)
Total Protein: 6.9 g/dL (ref 6.0–8.5)

## 2019-07-30 LAB — TSH: TSH: 1.16 u[IU]/mL (ref 0.450–4.500)

## 2019-07-30 LAB — PREGNANCY, URINE: Preg Test, Ur: NEGATIVE

## 2019-08-01 ENCOUNTER — Encounter: Payer: Self-pay | Admitting: Neurology

## 2019-08-01 DIAGNOSIS — G4440 Drug-induced headache, not elsewhere classified, not intractable: Secondary | ICD-10-CM | POA: Insufficient documentation

## 2019-08-01 DIAGNOSIS — G43711 Chronic migraine without aura, intractable, with status migrainosus: Secondary | ICD-10-CM | POA: Insufficient documentation

## 2019-08-01 DIAGNOSIS — Z79899 Other long term (current) drug therapy: Secondary | ICD-10-CM | POA: Insufficient documentation

## 2019-08-03 ENCOUNTER — Telehealth: Payer: Self-pay | Admitting: *Deleted

## 2019-08-03 NOTE — Telephone Encounter (Signed)
-----   Message from Melvenia Beam, MD sent at 08/01/2019  7:16 PM EDT ----- Labs unremarkable

## 2019-08-03 NOTE — Telephone Encounter (Signed)
Pt returned call and was informed of note left from RN. Pt understood.

## 2019-08-03 NOTE — Telephone Encounter (Signed)
Called pt & LVM asking for a call back.   When she calls back, please let her know her labs were unremarkable, no concerns. Thanks.

## 2019-08-19 ENCOUNTER — Other Ambulatory Visit: Payer: Medicaid Other

## 2019-09-07 ENCOUNTER — Telehealth: Payer: Self-pay | Admitting: Neurology

## 2019-09-07 MED ORDER — DEXAMETHASONE 2 MG PO TABS: ORAL_TABLET | ORAL | 0 refills | Status: DC

## 2019-09-07 NOTE — Telephone Encounter (Signed)
Patient called stating that she has been having migraines for the past 5-6 days. Please follow up.

## 2019-09-07 NOTE — Telephone Encounter (Signed)
I spoke with the patient. She had improvement with the first Ajovy dose, but she's had more trouble this month. She confirms this is her typical migraine, no new symptoms. She is getting ready to take her 3rd sample dose of Ajovy next week. The patient stated her MRI was denied by insurance. She stated Dr. Jaynee Eagles had discussed steroids or other migraine treatment, but they decided to do Ajovy. The pt is interested in a steroid dose pack or other acute migraine medication until her f/u with Dr. Jaynee Eagles. She stated she didn't have diabetes or any allergies to steroids, prednisone, methylprednisolone. She has been taking "5 or 6" of ibuprofen and tylenol during the day. The pt was advised against overusing these medications as it can affect her liver, kidneys, risk for bleeding. She verbalized understanding. She also understands Dr. Jaynee Eagles is currently out of the office but we will send a message to the [place] [facility] doctor. She verbalized appreciation.

## 2019-09-07 NOTE — Telephone Encounter (Signed)
I called the patient.  The patient is on Ajovy for her headaches, it seemed to help initially, her headaches have worsened over the last month.  She wants to have a short course of steroids to get rid of the headache, I will send in a 3-day course of Decadron.  The patient will be taking her next injection in the next couple days, it is possible she may have a wearing off effect from the medication.  She has had bad headaches over the last 5 or 6 days.

## 2019-10-21 ENCOUNTER — Telehealth: Payer: Self-pay | Admitting: Neurology

## 2019-10-21 ENCOUNTER — Other Ambulatory Visit: Payer: Self-pay | Admitting: Neurology

## 2019-10-21 ENCOUNTER — Telehealth: Payer: Self-pay | Admitting: *Deleted

## 2019-10-21 MED ORDER — PROPRANOLOL HCL 10 MG PO TABS: 10.0000 mg | ORAL_TABLET | Freq: Two times a day (BID) | ORAL | 3 refills | Status: DC

## 2019-10-21 MED ORDER — AIMOVIG 140 MG/ML ~~LOC~~ SOAJ: 140.0000 mg | SUBCUTANEOUS | 11 refills | Status: DC

## 2019-10-21 NOTE — Telephone Encounter (Signed)
Aimovig PA completed on [name] portal. Confirmation #: [id number]  Awaiting determination.

## 2019-10-21 NOTE — Telephone Encounter (Signed)
Pt called stating that she has been loosing a lot of hair and still having headaches being on the Fremanezumab-vfrm (AJOVY) 225 MG/1.5ML SOAJ Pt is wanting to know if she can try another medication that will help her out better with her situation. Pt has been r/s for March and she states she can not wait till then for a change. Please advise.

## 2019-10-21 NOTE — Telephone Encounter (Signed)
Patient also stated in the call that she is interested in a payment plan for her MRI. I told her a message would be sent to referrals for this.

## 2019-10-21 NOTE — Telephone Encounter (Signed)
I called patient and LVM requesting she call back to reschedule her 1/14 appt. Patient will need to see Amy, first available, per Dr. Lucia Gaskins.

## 2019-10-21 NOTE — Telephone Encounter (Signed)
Let's try Aimovig, if she is ok with it we can order thanks

## 2019-10-21 NOTE — Telephone Encounter (Signed)
Spoke with both patient and Dr. Lucia Gaskins. Pt reports hair falling out, bald spots since starting Ajovy. Also having headaches everyday. Dr. Lucia Gaskins suggests aimovig instead. Pt willing to try this however would also like to try an oral preventive. Pt denied any latex allergy, understands constipation is a side effect. Dr. Lucia Gaskins ordered propranolol 10 mg BID. Pt advised of instructions and advised if she were to feel lightheaded or tired to stop it per Dr. Lucia Gaskins. She understands this is a low dose oral BP medication used to treat the migraines. Also discussed Aimovig. Pt aware of instructions. She understands a PA will be needed. She verbalized appreciation. Her questions were answered. Pt aware pharmacist also available to answer any questions and we will have an on-call MD available over the weekend if needed. Pt's pharmacy will be [facility] in [place] on [address]. I called [facility] on [address] to cancel the Aimovig and Propranolol. Meds sent to [address] location.

## 2019-10-21 NOTE — Telephone Encounter (Signed)
[name], please reschedule patient's Thursday 14th 1:30pm  appointment to Amy please, first available. thanks

## 2019-10-25 NOTE — Telephone Encounter (Signed)
Patient's MRI was sent to [facility], any payment plan information will need to be handled with them. Their number is [phone]. I called the patient to make her aware but she did not answer. I LVM but if she calls back please relay this message to her.

## 2019-10-28 ENCOUNTER — Ambulatory Visit: Payer: Medicaid Other | Admitting: Neurology

## 2019-10-28 NOTE — Telephone Encounter (Signed)
Spoke with pt. She understands she needs to stop the OTC medications and advised the Aimovig would help her with this. She is unable to come by during the week because of her work schedule/location. She would like her brother to come by and pick the Aimovig sample up if this is possible. Will call pt back Monday during office hours.

## 2019-10-28 NOTE — Telephone Encounter (Signed)
Aimovig denied because patient has MOH.

## 2019-10-28 NOTE — Telephone Encounter (Signed)
We can give her sample and in the meantime over the next 3 months she has to stop the overuse of analgesics which the aimovig will help her do. thanks

## 2019-11-02 NOTE — Telephone Encounter (Signed)
I tried to call the pt to discuss her brother coming to pickup a sample if that is what she still would like. I received a busy signal. Will try to reach her again later.

## 2019-11-02 NOTE — Telephone Encounter (Signed)
I called the pt and LVM (ok per DPR) advising ok if she would like her brother to come and pick up a sample of the Aimovig. I asked for a call back to discuss. Left office number in message.

## 2019-12-22 ENCOUNTER — Ambulatory Visit: Payer: Medicaid Other | Admitting: Family Medicine

## 2019-12-22 ENCOUNTER — Encounter: Payer: Self-pay | Admitting: Family Medicine

## 2020-06-09 ENCOUNTER — Other Ambulatory Visit: Payer: Self-pay

## 2020-06-09 ENCOUNTER — Other Ambulatory Visit: Payer: Medicaid Other

## 2020-06-09 DIAGNOSIS — Z20822 Contact with and (suspected) exposure to covid-19: Secondary | ICD-10-CM

## 2020-06-10 LAB — SARS-COV-2, NAA 2 DAY TAT

## 2020-06-10 LAB — NOVEL CORONAVIRUS, NAA: SARS-CoV-2, NAA: NOT DETECTED

## 2020-08-04 ENCOUNTER — Emergency Department: Payer: Medicaid Other

## 2020-08-04 ENCOUNTER — Other Ambulatory Visit: Payer: Self-pay

## 2020-08-04 ENCOUNTER — Emergency Department
Admission: EM | Admit: 2020-08-04 | Discharge: 2020-08-04 | Disposition: A | Discharge status: Home / Self Care | Payer: Medicaid Other | Attending: Emergency Medicine | Admitting: Emergency Medicine

## 2020-08-04 ENCOUNTER — Encounter: Payer: Self-pay | Admitting: Emergency Medicine

## 2020-08-04 DIAGNOSIS — U071 COVID-19: Secondary | ICD-10-CM | POA: Diagnosis not present

## 2020-08-04 DIAGNOSIS — R509 Fever, unspecified: Secondary | ICD-10-CM | POA: Diagnosis present

## 2020-08-04 DIAGNOSIS — Z8669 Personal history of other diseases of the nervous system and sense organs: Secondary | ICD-10-CM | POA: Insufficient documentation

## 2020-08-04 DIAGNOSIS — Z79899 Other long term (current) drug therapy: Secondary | ICD-10-CM | POA: Diagnosis not present

## 2020-08-04 LAB — BASIC METABOLIC PANEL
Anion gap: 10 (ref 5–15)
BUN: 8 mg/dL (ref 6–20)
CO2: 23 mmol/L (ref 22–32)
Calcium: 8.7 mg/dL — ABNORMAL LOW (ref 8.9–10.3)
Chloride: 108 mmol/L (ref 98–111)
Creatinine, Ser: 0.55 mg/dL (ref 0.44–1.00)
GFR, Estimated: >60 mL/min (ref >60)
Glucose, Bld: 104 mg/dL — ABNORMAL HIGH (ref 70–99)
Potassium: 4.1 mmol/L (ref 3.5–5.1)
Sodium: 141 mmol/L (ref 135–145)

## 2020-08-04 LAB — CBC
HCT: 41.5 % (ref 36.0–46.0)
Hemoglobin: 14.7 g/dL (ref 12.0–15.0)
MCH: 30.9 pg (ref 26.0–34.0)
MCHC: 35.4 g/dL (ref 30.0–36.0)
MCV: 87.2 fL (ref 80.0–100.0)
Platelets: 194 10*3/uL (ref 150–400)
RBC: 4.76 MIL/uL (ref 3.87–5.11)
RDW: 12.2 % (ref 11.5–15.5)
WBC: 4.3 10*3/uL (ref 4.0–10.5)
nRBC: 0 % (ref 0.0–0.2)

## 2020-08-04 MED ORDER — SODIUM CHLORIDE 0.9 % IV BOLUS
500.0000 mL | Freq: Once | INTRAVENOUS | Status: AC
  Administered 2020-08-04: 500 mL via INTRAVENOUS

## 2020-08-04 MED ORDER — FLUTICASONE PROPIONATE 50 MCG/ACT NA SUSP: 2.0000 | Freq: Every day | NASAL | 0 refills | Status: DC

## 2020-08-04 MED ORDER — ONDANSETRON HCL 4 MG/2ML IJ SOLN
4.0000 mg | Freq: Once | INTRAMUSCULAR | Status: AC
  Administered 2020-08-04: 4 mg via INTRAVENOUS
  Filled 2020-08-04: qty 2

## 2020-08-04 MED ORDER — ALBUTEROL SULFATE HFA 108 (90 BASE) MCG/ACT IN AERS: 2.0000 | INHALATION_SPRAY | Freq: Four times a day (QID) | RESPIRATORY_TRACT | 0 refills | Status: DC | PRN

## 2020-08-04 MED ORDER — BENZONATATE 100 MG PO CAPS: 100.0000 mg | ORAL_CAPSULE | Freq: Three times a day (TID) | ORAL | 0 refills | Status: DC | PRN

## 2020-08-04 MED ORDER — KETOROLAC TROMETHAMINE 30 MG/ML IJ SOLN
30.0000 mg | Freq: Once | INTRAMUSCULAR | Status: AC
  Administered 2020-08-04: 30 mg via INTRAMUSCULAR
  Filled 2020-08-04: qty 1

## 2020-08-04 NOTE — ED Provider Notes (Signed)
[facility] Emergency Department Provider Note  ____________________________________________  Time seen: Approximately 7:03 PM  I have reviewed the triage vital signs and the nursing notes.   HISTORY  Chief Complaint Fever and Sore Throat    HPI [name] is a 24 y.o. female that presents to the emergency department for evaluation of COVID-19 symptoms.  Patient states that for 5 days she has had a fever, nasal congestion, sore throat, shortness of breath, chest pain, nonproductive cough.  Patient states that she has not been eating because she has no taste.  Patient tested positive for Covid yesterday.  She states that she gets bronchitis once per year and this feels worse.  She would like to know if she can have an IV or a Covid vaccine today.  No vomiting, diarrhea.  Past Medical History:  Diagnosis Date  . Abdominal pain   . Allergy   . Anxiety   . Constipation   . Diarrhea   . Headache(784.0)   . HSV (herpes simplex virus) anogenital infection   . Proctalgia   . Yeast infection     Patient Active Problem List   Diagnosis Date Noted  . Chronic migraine without aura, with intractable migraine, so stated, with status migrainosus 08/01/2019  . Long term use of drug 08/01/2019  . Medication overuse headache 08/01/2019  . SVD (spontaneous vaginal delivery) 03/14/2017  . Indication for care in labor or delivery 03/13/2017  . Nausea & vomiting 06/02/2014  . Anxiety 06/02/2014  . Nausea and vomiting 06/02/2014  . Intestinal bacterial overgrowth 08/03/2012  . Pyrosis 07/09/2012  . Generalized abdominal pain   . Appendicitis, acute 08/18/2011  . Constipation   . Proctalgia     Past Surgical History:  Procedure Laterality Date  . APPENDECTOMY  08/18/11  . APPENDECTOMY    . birth control implant in arm     Hx: of  . ESOPHAGOGASTRODUODENOSCOPY N/A 02/19/2013   Procedure: ESOPHAGOGASTRODUODENOSCOPY (EGD);  Surgeon: Jon Gills, MD;  [facility] OR;  Service: Gastroenterology;  Laterality: N/A;  . LAPAROSCOPIC APPENDECTOMY  08/18/2011   Procedure: APPENDECTOMY LAPAROSCOPIC;  Surgeon: Judie Petit. Leonia Corona, MD;  Location: [facility] OR;  Service: Pediatrics;;    Prior to Admission medications   Medication Sig Start Date End Date Taking? Authorizing Provider  albuterol (VENTOLIN HFA) 108 (90 Base) MCG/ACT inhaler Inhale 2 puffs into the lungs every 6 (six) hours as needed for wheezing or shortness of breath. 08/04/20   Enid Derry, PA-C  azithromycin (ZITHROMAX Z-PAK) 250 MG tablet 2 pills today then 1 pill a day for 4 days Patient not taking: Reported on 07/29/2019 09/06/18   Faythe Ghee, PA-C  benzonatate (TESSALON PERLES) 100 MG capsule Take 1 capsule (100 mg total) by mouth 3 (three) times daily as needed. 08/04/20 08/04/21  Enid Derry, PA-C  busPIRone (BUSPAR) 5 MG tablet Take 5 mg by mouth 2 (two) times daily. 05/26/19   [provider]  dexamethasone (DECADRON) 2 MG tablet Take 3 tablets the first day, 2 the second and 1 the third day 09/07/19   York Spaniel, MD  Erenumab-aooe (AIMOVIG) 140 MG/ML SOAJ Inject 140 mg into the skin every 30 (thirty) days. 10/21/19   Anson Fret, MD  fluticasone (FLONASE) 50 MCG/ACT nasal spray Place 2 sprays into both nostrils daily. 08/04/20 08/04/21  Enid Derry, PA-C  HYDROcodone-acetaminophen (NORCO/VICODIN) 5-325 MG tablet Take 1 tablet by mouth every 6 (six) hours as needed for moderate pain. Patient not taking: Reported  on 07/29/2019 09/06/18   Faythe Ghee, PA-C  predniSONE (STERAPRED UNI-PAK 21 TAB) 10 MG (21) TBPK tablet Take 6 pills on day one then decrease by 1 pill each day Patient not taking: Reported on 07/29/2019 09/06/18   Faythe Ghee, PA-C  propranolol (INDERAL) 10 MG tablet Take 1 tablet (10 mg total) by mouth 2 (two) times daily. 10/21/19   Anson Fret, MD  SUMAtriptan (IMITREX) 50 MG tablet TAKE 1 TABLET AS NEEDED FOR MIGRAINE. CAN REPEAT IN 2 HOURS  IF NO RELIEF. DO NOT EXCEED 4 TABS/DAY 05/26/19   [provider]    Allergies Amoxicillin, Penicillins, Phenergan [promethazine], and Penicillins  Family History  Problem Relation Age of Onset  . Arthritis Maternal Grandmother   . Hearing loss Maternal Grandmother   . Diabetes Maternal Grandmother   . Cancer Maternal Grandfather   . Bipolar disorder Brother   . Cancer Father   . Migraines Neg Hx     Social History Social History   Tobacco Use  . Smoking status: Never Smoker  . Smokeless tobacco: Never Used  Vaping Use  . Vaping Use: Never used  Substance Use Topics  . Alcohol use: Yes    Comment: occasionally  . Drug use: Not Currently    Types: Marijuana     Review of Systems  Constitutional: Positive for fever. Eyes: No visual changes. No discharge. ENT: Positive for congestion and rhinorrhea. Cardiovascular: Positive for chest discomfort. Respiratory: Positive for cough and SOB. Gastrointestinal: No abdominal pain.  No nausea, no vomiting.  No diarrhea.  No constipation. Musculoskeletal: Positive for body aches. Skin: Negative for rash, abrasions, lacerations, ecchymosis. Neurological: Negative for headaches.   ____________________________________________   PHYSICAL EXAM:  VITAL SIGNS: ED Triage Vitals [08/04/20 1716]  Enc Vitals Group     BP 136/90     Pulse Rate (!) 115     Resp 20     Temp 98.3 F (36.8 C)     Temp Source Oral     SpO2 97 %     Weight 179 lb (81.2 kg)     Height 5\' 5"  (1.651 m)     Head Circumference      Peak Flow      Pain Score 10     Pain Loc      Pain Edu?      Excl. in GC?      Constitutional: Alert and oriented. Well appearing and in no acute distress. Eyes: Conjunctivae are normal. PERRL. EOMI. No discharge. Head: Atraumatic. ENT: No frontal and maxillary sinus tenderness.      Ears: Tympanic membranes pearly gray with good landmarks. No discharge.      Nose: Mild congestion/rhinnorhea.       Mouth/Throat: Mucous membranes are moist. Oropharynx non-erythematous. Tonsils not enlarged. No exudates. Uvula midline. Neck: No stridor.   Hematological/Lymphatic/Immunilogical: No cervical lymphadenopathy. Cardiovascular: Normal rate, regular rhythm.  Good peripheral circulation. Respiratory: Normal respiratory effort without tachypnea or retractions. Lungs CTAB. Good air entry to the bases with no decreased or absent breath sounds. Gastrointestinal: Bowel sounds 4 quadrants. Soft and nontender to palpation. No guarding or rigidity. No palpable masses. No distention. Musculoskeletal: Full range of motion to all extremities. No gross deformities appreciated. Neurologic:  Normal speech and language. No gross focal neurologic deficits are appreciated.  Skin:  Skin is warm, dry and intact. No rash noted. Psychiatric: Mood and affect are normal. Speech and behavior are normal. Patient exhibits appropriate insight and judgement.  ____________________________________________   LABS (all labs ordered are listed, but only abnormal results are displayed)  Labs Reviewed  BASIC METABOLIC PANEL - Abnormal; Notable for the following components:      Result Value   Glucose, Bld 104 (*)    Calcium 8.7 (*)    All other components within normal limits  CBC   ____________________________________________  EKG   ____________________________________________  RADIOLOGY Lexine Baton, personally viewed and evaluated these images (plain radiographs) as part of my medical decision making, as well as reviewing the written report by the radiologist.  DG Chest 1 View  Result Date: 08/04/2020 CLINICAL DATA:  COVID positive. Continuing symptoms including cough, fever, sore throat, and chills. EXAM: CHEST  1 VIEW COMPARISON:  03/18/2014 FINDINGS: The heart size and mediastinal contours are within normal limits. Both lungs are clear. The visualized skeletal structures are unremarkable. IMPRESSION: No  active disease. Electronically Signed   By: Burman Nieves M.D.   On: 08/04/2020 19:48    ____________________________________________    PROCEDURES  Procedure(s) performed:    Procedures    Medications  sodium chloride 0.9 % bolus 500 mL (0 mLs Intravenous Stopped 08/04/20 2024)  ondansetron (ZOFRAN) injection 4 mg (4 mg Intravenous Given 08/04/20 1926)  ketorolac (TORADOL) 30 MG/ML injection 30 mg (30 mg Intramuscular Given 08/04/20 1926)     ____________________________________________   INITIAL IMPRESSION / ASSESSMENT AND PLAN / ED COURSE  Pertinent labs & imaging results that were available during my care of the patient were reviewed by me and considered in my medical decision making (see chart for details).  Review of the [place] CSRS was performed in accordance of the [facility] prior to dispensing any controlled drugs.   Patient's diagnosis is consistent with COVID-19. Vital signs and exam are reassuring.  Lab work largely unremarkable.  Chest x-ray negative for acute cardiopulmonary processes.  Sinus rhythm on EKG.  Patient appears well and is staying well hydrated. Patient feels comfortable going home and is requesting discharge. Patient will be discharged home with prescriptions for Tessalon Perles, albuterol inhaler.  Patient is to follow up with primary care as needed or otherwise directed. Patient is given ED precautions to return to the ED for any worsening or new symptoms.  [name] was evaluated in Emergency Department on 08/04/2020 for the symptoms described in the history of present illness. She was evaluated in the context of the global COVID-19 pandemic, which necessitated consideration that the patient might be at risk for infection with the SARS-CoV-2 virus that causes COVID-19. Institutional protocols and algorithms that pertain to the evaluation of patients at risk for COVID-19 are in a state of rapid change based on information released by regulatory bodies  including the CDC and federal and state organizations. These policies and algorithms were followed during the patient's care in the ED.   ____________________________________________  FINAL CLINICAL IMPRESSION(S) / ED DIAGNOSES  Final diagnoses:  COVID-19      NEW MEDICATIONS STARTED DURING THIS VISIT:  ED Discharge Orders         Ordered    albuterol (VENTOLIN HFA) 108 (90 Base) MCG/ACT inhaler  Every 6 hours PRN        08/04/20 2045    benzonatate (TESSALON PERLES) 100 MG capsule  3 times daily PRN        08/04/20 2045    fluticasone (FLONASE) 50 MCG/ACT nasal spray  Daily        08/04/20 2045  This chart was dictated using voice recognition software/Dragon. Despite best efforts to proofread, errors can occur which can change the meaning. Any change was purely unintentional.    Enid DerryWagner, Lorrine Killilea, PA-C 08/04/20 2155    Shaune PollackIsaacs, Cameron, MD 08/08/20 0130

## 2020-08-04 NOTE — ED Triage Notes (Signed)
Pt comes into the Ed via POV c/o COVID symptoms that have continued including cough, fever, sore throat, and fever/chills.  Pt diagnosed with COVID on 08/03/20.  Pt in NAD at this time with even and unlabored respirations.  Pt states she just isnt feeling better and it has her tired and fatigued.

## 2020-08-04 NOTE — ED Notes (Signed)
Bed locked low. Call bell within reach.

## 2021-02-21 ENCOUNTER — Other Ambulatory Visit: Payer: Self-pay

## 2021-02-21 ENCOUNTER — Encounter: Payer: Self-pay | Admitting: Certified Nurse Midwife

## 2021-02-21 ENCOUNTER — Ambulatory Visit (INDEPENDENT_AMBULATORY_CARE_PROVIDER_SITE_OTHER): Payer: Medicaid Other | Admitting: Certified Nurse Midwife

## 2021-02-21 VITALS — BP 125/84 | HR 97 | Resp 16 | Ht 65.0 in | Wt 192.7 lb

## 2021-02-21 DIAGNOSIS — Z30432 Encounter for removal of intrauterine contraceptive device: Secondary | ICD-10-CM | POA: Diagnosis not present

## 2021-02-21 NOTE — Progress Notes (Signed)
   GYNECOLOGY OFFICE PROCEDURE NOTE  [name] is a 25 y.o. G1P1001 here for mirena IUD removal. No GYN concerns.  State she has had it in 3 yrs and has had recurrent vaginal infections and hormonal side effects.   IUD Removal  Patient identified, informed consent performed, consent signed.  Patient was in the dorsal lithotomy position, normal external genitalia was noted.  A speculum was placed in the patient's vagina, normal discharge was noted, no lesions. The cervix was visualized, no lesions, no abnormal discharge.  The strings of the IUD were grasped and pulled using ring forceps. The IUD was removed in its entirety.   Patient tolerated the procedure well.    Patient will use condoms for contraception Routine preventative health maintenance measures emphasized.   Doreene Burke, CNM

## 2021-02-21 NOTE — Patient Instructions (Signed)

## 2021-02-28 ENCOUNTER — Encounter: Payer: Medicaid Other | Admitting: Obstetrics and Gynecology

## 2021-04-23 ENCOUNTER — Encounter (HOSPITAL_COMMUNITY): Payer: Self-pay | Admitting: Emergency Medicine

## 2021-04-23 ENCOUNTER — Emergency Department (HOSPITAL_COMMUNITY)
Admission: EM | Admit: 2021-04-23 | Discharge: 2021-04-23 | Disposition: A | Discharge status: Home / Self Care | Payer: Medicaid Other | Attending: Emergency Medicine | Admitting: Emergency Medicine

## 2021-04-23 ENCOUNTER — Emergency Department (HOSPITAL_COMMUNITY): Payer: Medicaid Other

## 2021-04-23 DIAGNOSIS — O4690 Antepartum hemorrhage, unspecified, unspecified trimester: Secondary | ICD-10-CM

## 2021-04-23 DIAGNOSIS — B9689 Other specified bacterial agents as the cause of diseases classified elsewhere: Secondary | ICD-10-CM

## 2021-04-23 DIAGNOSIS — O3680X0 Pregnancy with inconclusive fetal viability, not applicable or unspecified: Secondary | ICD-10-CM

## 2021-04-23 DIAGNOSIS — O23591 Infection of other part of genital tract in pregnancy, first trimester: Secondary | ICD-10-CM | POA: Insufficient documentation

## 2021-04-23 DIAGNOSIS — N898 Other specified noninflammatory disorders of vagina: Secondary | ICD-10-CM | POA: Insufficient documentation

## 2021-04-23 DIAGNOSIS — Z3A01 Less than 8 weeks gestation of pregnancy: Secondary | ICD-10-CM | POA: Diagnosis not present

## 2021-04-23 DIAGNOSIS — O209 Hemorrhage in early pregnancy, unspecified: Secondary | ICD-10-CM | POA: Diagnosis present

## 2021-04-23 DIAGNOSIS — N760 Acute vaginitis: Secondary | ICD-10-CM

## 2021-04-23 DIAGNOSIS — R58 Hemorrhage, not elsewhere classified: Secondary | ICD-10-CM

## 2021-04-23 LAB — CBC WITH DIFFERENTIAL/PLATELET
Abs Immature Granulocytes: 0.01 10*3/uL (ref 0.00–0.07)
Basophils Absolute: 0 10*3/uL (ref 0.0–0.1)
Basophils Relative: 1 %
Eosinophils Absolute: 0.1 10*3/uL (ref 0.0–0.5)
Eosinophils Relative: 2 %
HCT: 41.6 % (ref 36.0–46.0)
Hemoglobin: 14.3 g/dL (ref 12.0–15.0)
Immature Granulocytes: 0 %
Lymphocytes Relative: 31 %
Lymphs Abs: 2.4 10*3/uL (ref 0.7–4.0)
MCH: 30.5 pg (ref 26.0–34.0)
MCHC: 34.4 g/dL (ref 30.0–36.0)
MCV: 88.7 fL (ref 80.0–100.0)
Monocytes Absolute: 0.5 10*3/uL (ref 0.1–1.0)
Monocytes Relative: 7 %
Neutro Abs: 4.6 10*3/uL (ref 1.7–7.7)
Neutrophils Relative %: 59 %
Platelets: 228 10*3/uL (ref 150–400)
RBC: 4.69 MIL/uL (ref 3.87–5.11)
RDW: 12.7 % (ref 11.5–15.5)
WBC: 7.7 10*3/uL (ref 4.0–10.5)
nRBC: 0 % (ref 0.0–0.2)

## 2021-04-23 LAB — BASIC METABOLIC PANEL
Anion gap: 7 (ref 5–15)
BUN: 12 mg/dL (ref 6–20)
CO2: 24 mmol/L (ref 22–32)
Calcium: 9 mg/dL (ref 8.9–10.3)
Chloride: 105 mmol/L (ref 98–111)
Creatinine, Ser: 0.61 mg/dL (ref 0.44–1.00)
GFR, Estimated: >60 mL/min (ref >60)
Glucose, Bld: 113 mg/dL — ABNORMAL HIGH (ref 70–99)
Potassium: 3.8 mmol/L (ref 3.5–5.1)
Sodium: 136 mmol/L (ref 135–145)

## 2021-04-23 LAB — I-STAT BETA HCG BLOOD, ED (MC, WL, AP ONLY): I-stat hCG, quantitative: 936 m[IU]/mL — ABNORMAL HIGH (ref <5)

## 2021-04-23 LAB — WET PREP, GENITAL
Clue Cells Wet Prep HPF POC: NONE SEEN
Sperm: NONE SEEN
Trich, Wet Prep: NONE SEEN
Yeast Wet Prep HPF POC: NONE SEEN

## 2021-04-23 LAB — ABO/RH: ABO/RH(D): B POS

## 2021-04-23 MED ORDER — METRONIDAZOLE 500 MG PO TABS: 500.0000 mg | ORAL_TABLET | Freq: Two times a day (BID) | ORAL | 0 refills | Status: DC

## 2021-04-23 NOTE — ED Triage Notes (Addendum)
Patient reports positive pregnancy test last week. Reports bright red vaginal bleeding intermittent x1 week. LMP 6/1

## 2021-04-23 NOTE — ED Provider Notes (Signed)
[facility] EMERGENCY DEPARTMENT Provider Note  CSN: [id number] Arrival date & time: 04/23/21 0973    History Chief Complaint  Patient presents with   Vaginal Bleeding     [name] is a 25 y.o. female who is G2P1A0 at approx 5 weeks by LMP reports she has had some vaginal spotting and occasional cramping for the last several days since she had a positive home pregnancy test about a week ago. She also reports some discharge and is concerned about BV. She is not worried about STI. She denies any vomiting but has had some nausea. No fever or dysuria.   Past Medical History:  Diagnosis Date   Abdominal pain    Allergy    Anxiety    Constipation    Diarrhea    Headache(784.0)    HSV (herpes simplex virus) anogenital infection    Proctalgia    Yeast infection     Past Surgical History:  Procedure Laterality Date   APPENDECTOMY  08/18/11   APPENDECTOMY     birth control implant in arm     Hx: of   ESOPHAGOGASTRODUODENOSCOPY N/A 02/19/2013   Procedure: ESOPHAGOGASTRODUODENOSCOPY (EGD);  Surgeon: Jon Gills, MD;  Location: [facility] OR;  Service: Gastroenterology;  Laterality: N/A;   LAPAROSCOPIC APPENDECTOMY  08/18/2011   Procedure: APPENDECTOMY LAPAROSCOPIC;  Surgeon: Judie Petit. Leonia Corona, MD;  Location: [facility] OR;  Service: Pediatrics;;    Family History  Problem Relation Age of Onset   Arthritis Maternal Grandmother    Hearing loss Maternal Grandmother    Diabetes Maternal Grandmother    Cancer Maternal Grandfather    Bipolar disorder Brother    Cancer Father    Migraines Neg Hx     Social History   Tobacco Use   Smoking status: Never   Smokeless tobacco: Never  Vaping Use   Vaping Use: Never used  Substance Use Topics   Alcohol use: Yes    Comment: occasionally   Drug use: Not Currently    Types: Marijuana     Home Medications Prior to Admission medications   Medication Sig Start Date End Date Taking? Authorizing Provider  acetaminophen (TYLENOL) 500 MG  tablet Take 500 mg by mouth every 6 (six) hours as needed for mild pain.   Yes [provider]  metroNIDAZOLE (FLAGYL) 500 MG tablet Take 1 tablet (500 mg total) by mouth 2 (two) times daily. 04/23/21  Yes Pollyann Savoy, MD  Prenatal Vit-Fe Fumarate-FA (PRENATAL MULTIVITAMIN) TABS tablet Take 1 tablet by mouth daily at 12 noon.   Yes [provider]     Allergies    Amoxicillin, Penicillins, Phenergan [promethazine], and Penicillins   Review of Systems   Review of Systems A comprehensive review of systems was completed and negative except as noted in HPI.    Physical Exam BP 118/75   Pulse 62   Temp 98.4 F (36.9 C) (Oral)   Resp 17   SpO2 100%   Physical Exam Vitals and nursing note reviewed.  Constitutional:      Appearance: Normal appearance.  HENT:     Head: Normocephalic and atraumatic.     Nose: Nose normal.     Mouth/Throat:     Mouth: Mucous membranes are moist.  Eyes:     Extraocular Movements: Extraocular movements intact.     Conjunctiva/sclera: Conjunctivae normal.  Cardiovascular:     Rate and Rhythm: Normal rate.  Pulmonary:     Effort: Pulmonary effort is normal.  Breath sounds: Normal breath sounds.  Abdominal:     General: Abdomen is flat.     Palpations: Abdomen is soft.     Tenderness: There is no abdominal tenderness.  Genitourinary:    Comments: Chaperone present. Thin white discharge, no bleeding or POC. Cervix is closed Musculoskeletal:        General: No swelling. Normal range of motion.     Cervical back: Neck supple.  Skin:    General: Skin is warm and dry.  Neurological:     General: No focal deficit present.     Mental Status: She is alert.  Psychiatric:        Mood and Affect: Mood normal.     ED Results / Procedures / Treatments   Labs (all labs ordered are listed, but only abnormal results are displayed) Labs Reviewed  WET PREP, GENITAL - Abnormal; Notable for the following components:      Result  Value   WBC, Wet Prep HPF POC MANY (*)    All other components within normal limits  BASIC METABOLIC PANEL - Abnormal; Notable for the following components:   Glucose, Bld 113 (*)    All other components within normal limits  I-STAT BETA HCG BLOOD, ED ([facility], [facility], [facility] ONLY) - Abnormal; Notable for the following components:   I-stat hCG, quantitative 936.0 (*)    All other components within normal limits  CBC WITH DIFFERENTIAL/PLATELET  ABO/RH    EKG None  Radiology US OB LESS THAN 14 WEEKS WITH OB TRANSVAGINAL  Result Date: 04/23/2021 CLINICAL DATA:  Bleeding in first trimester pregnancy, LMP 03/16/2021, spotting yesterday, quantitative beta HCG = 900 EXAM: OBSTETRIC <14 WK [place] AND TRANSVAGINAL OB US TECHNIQUE: Both transabdominal and transvaginal ultrasound examinations were performed for complete evaluation of the gestation as well as the maternal uterus, adnexal regions, and pelvic cul-de-sac. Transvaginal technique was performed to assess early pregnancy. COMPARISON:  None FINDINGS: Intrauterine gestational sac: Absent Yolk sac:  N/A Embryo:  N/A Cardiac Activity: N/A Heart Rate: N/A  bpm MSD:   mm    w     d CRL:    mm    w    d                  [place] EDC: Subchorionic hemorrhage:  N/A Maternal uterus/adnexae: Uterus anteverted, normal appearance. No uterine mass, endometrial fluid, or gestational sac identified. Small amount of free pelvic fluid in cul-de-sac, nonspecific. Corpus luteum within LEFT ovary, otherwise normal appearing ovaries. No adnexal masses. IMPRESSION: No intrauterine gestation identified. Findings are consistent with pregnancy of unknown location. Differential diagnosis includes early intrauterine pregnancy too early to visualize, spontaneous abortion, and ectopic pregnancy. Serial quantitative beta HCG and or follow-up ultrasound recommended to definitively exclude ectopic pregnancy. Electronically Signed   By: Ulyses Southward M.D.   On: 04/23/2021 10:56     Procedures Procedures  Medications Ordered in the ED Medications - No data to display   MDM Rules/Calculators/A&P MDM Will confirm pregnancy with I-stat HCG and if positive send for [place] given bleeding and cramping. Pelvic exam for wet-prep. She declines GC/CT testing today.   ED Course  I have reviewed the triage vital signs and the nursing notes.  Pertinent labs & imaging results that were available during my care of the patient were reviewed by me and considered in my medical decision making (see chart for details).  Clinical Course as of 04/23/21 1113  Mon Apr 23, 2021  5284 CBC  is normal.  [CS]  0853 BMP is normal.  [CS]  0904 HCG is positive. Will send for [place].  [CS]  0914 Rh pos [CS]  1108 Wet prep with WBC, will treat for BV. Discussed [place] and HCG results with the patient. Advised this could mean either early pregnancy of unknown location or a missed miscarriage. Recommend she have her HCG rechecked at Ob/Gyn in 2 days. She has mentioned that her usual Ob no longer takes her insurance so she is unsure if she will be able to get seen at a new clinic on short notice. Advised she can return to the MAU if needed.  [CS]    Clinical Course User Index [CS] Pollyann Savoy, MD    Final Clinical Impression(s) / ED Diagnoses Final diagnoses:  Vaginal bleeding in pregnancy  Pregnancy of unknown anatomic location  BV (bacterial vaginosis)    Rx / DC Orders ED Discharge Orders          Ordered    metroNIDAZOLE (FLAGYL) 500 MG tablet  2 times daily        04/23/21 1113             Pollyann Savoy, MD 04/23/21 1113

## 2021-04-25 ENCOUNTER — Encounter (HOSPITAL_COMMUNITY): Payer: Self-pay | Admitting: Obstetrics & Gynecology

## 2021-04-25 ENCOUNTER — Other Ambulatory Visit: Payer: Self-pay | Admitting: Student

## 2021-04-25 ENCOUNTER — Telehealth: Payer: Self-pay | Admitting: Student

## 2021-04-25 ENCOUNTER — Inpatient Hospital Stay (HOSPITAL_COMMUNITY)
Admission: AD | Admit: 2021-04-25 | Discharge: 2021-04-25 | Disposition: A | Discharge status: Home / Self Care | Payer: Medicaid Other | Attending: Obstetrics & Gynecology | Admitting: Obstetrics & Gynecology

## 2021-04-25 ENCOUNTER — Other Ambulatory Visit: Payer: Self-pay

## 2021-04-25 DIAGNOSIS — Z3491 Encounter for supervision of normal pregnancy, unspecified, first trimester: Secondary | ICD-10-CM

## 2021-04-25 DIAGNOSIS — Z3A01 Less than 8 weeks gestation of pregnancy: Secondary | ICD-10-CM | POA: Insufficient documentation

## 2021-04-25 LAB — COMPREHENSIVE METABOLIC PANEL
ALT: 27 U/L (ref 0–44)
AST: 19 U/L (ref 15–41)
Albumin: 3.8 g/dL (ref 3.5–5.0)
Alkaline Phosphatase: 67 U/L (ref 38–126)
Anion gap: 8 (ref 5–15)
BUN: 8 mg/dL (ref 6–20)
CO2: 23 mmol/L (ref 22–32)
Calcium: 9.2 mg/dL (ref 8.9–10.3)
Chloride: 105 mmol/L (ref 98–111)
Creatinine, Ser: 0.63 mg/dL (ref 0.44–1.00)
GFR, Estimated: >60 mL/min (ref >60)
Glucose, Bld: 108 mg/dL — ABNORMAL HIGH (ref 70–99)
Potassium: 3.8 mmol/L (ref 3.5–5.1)
Sodium: 136 mmol/L (ref 135–145)
Total Bilirubin: 0.5 mg/dL (ref 0.3–1.2)
Total Protein: 6.8 g/dL (ref 6.5–8.1)

## 2021-04-25 LAB — HCG, QUANTITATIVE, PREGNANCY: hCG, Beta Chain, Quant, S: 3583 m[IU]/mL — ABNORMAL HIGH (ref <5)

## 2021-04-25 NOTE — MAU Note (Signed)
Presents stating she was instructed to have HCG level drawn today.  Seen in [facility] ED on Monday for c/o VB.  Ultrasound & HCG level done while 2 ED.  Unsure LMP, maybe first week of June 2022.

## 2021-04-25 NOTE — MAU Provider Note (Signed)
Patient [name] is 25 y.o. G2P1001 at  5 weeks by LMP here for follow-up bHCG. She denies pain or bleeding; she had some bleeding on Monday and went to [facility] ED. See note from [facility] ED.  She denies any pain or bleeding now; [place]   MAU Course  Procedures  MDM -reviewed [place] results from 07/11; no SIUP -beta was over 2,000 -will draw bHCG and CMP today  Patient does not want to wait (she has her son with her); she will wait for a phone call.  Reviewed that we may need her to come back for further work-up, depending on results. Patient agrees and confirmed her phone number in her chart.   [name]  [name] [name] 04/25/2021, 10:35 AM

## 2021-04-25 NOTE — Telephone Encounter (Signed)
Called patient and discussed her appropriate rise in quants. Patient will have outpatient Korea in two weeks; order placed. All questions answered.   [name]

## 2021-05-09 ENCOUNTER — Telehealth: Payer: Self-pay | Admitting: Advanced Practice Midwife

## 2021-05-09 ENCOUNTER — Other Ambulatory Visit: Payer: Self-pay

## 2021-05-09 ENCOUNTER — Ambulatory Visit
Admission: RE | Admit: 2021-05-09 | Discharge: 2021-05-09 | Disposition: A | Discharge status: Home / Self Care | Payer: Medicaid Other | Source: Ambulatory Visit | Attending: Student | Admitting: Student

## 2021-05-09 DIAGNOSIS — Z3491 Encounter for supervision of normal pregnancy, unspecified, first trimester: Secondary | ICD-10-CM | POA: Diagnosis present

## 2021-05-09 DIAGNOSIS — O219 Vomiting of pregnancy, unspecified: Secondary | ICD-10-CM

## 2021-05-09 MED ORDER — METOCLOPRAMIDE HCL 10 MG PO TABS
10.0000 mg | ORAL_TABLET | Freq: Four times a day (QID) | ORAL | 2 refills | Status: DC | PRN
Start: 2021-05-09 — End: 2021-10-03

## 2021-05-09 NOTE — Telephone Encounter (Signed)
Pt had viability [place] at [facility] today which showed viable IUP at [redacted]w[redacted]d.  I called pt to review normal US findings.  Pt not available so message left for pt to return call to [phone] for results.

## 2021-05-09 NOTE — Telephone Encounter (Signed)
Pt returned call, reviewed normal early US findings with pt.  Pt reports she has called multiple practices and noone accepts her insurance.  I encouraged pt to f/u with [facility] for appointment and contact information was given.  Pt reports nausea daily, no vomiting.  She has taken Phenergan in the past but it made things worse.  Rx for Reglan 10 mg Q 6 hours PRN. Pt to call the office if she needs medication management or return to MAU for severe symptoms/emergencies.

## 2021-05-11 ENCOUNTER — Telehealth: Payer: Medicaid Other | Admitting: Nurse Practitioner

## 2021-05-11 DIAGNOSIS — O26899 Other specified pregnancy related conditions, unspecified trimester: Secondary | ICD-10-CM

## 2021-05-11 DIAGNOSIS — Z3490 Encounter for supervision of normal pregnancy, unspecified, unspecified trimester: Secondary | ICD-10-CM

## 2021-05-11 MED ORDER — METRONIDAZOLE 500 MG PO TABS: 500.0000 mg | ORAL_TABLET | Freq: Two times a day (BID) | ORAL | 0 refills | Status: DC

## 2021-05-11 NOTE — Progress Notes (Signed)
Based on what you shared with me it looks like you have bacterial vaginosis,that should be evaluated in a face to face office visit. Due to pregnancy , you need face to face visit to make sure properly treated with meds that will not interfere with babay.  NOTE: There will be NO CHARGE for this eVisit   If you are having a true medical emergency please call 911.      For an urgent face to face visit, [place] has six urgent care centers for your convenience:     [facility] Health Urgent Care Center at [facility] Directions [phone] [address] [place], [place] [address]    [facility] Health Urgent Care Center [facility]) Get Driving Directions [phone] [address] [place], [place] [address]  [facility] Health Urgent Care Center [facility]) Get Driving Directions [phone] [address] [place],  [place]  [address]  [facility] Health Urgent Care at [facility] Get Driving Directions [phone] [address], [address] [place], [place] [address]   [facility] Health Urgent Care at [facility] Get Driving Directions  [phone] [address] [place], [place] [address]   [facility] Health Urgent Care at [facility] Directions [phone] [address], [address] [place], [place] [address]  Your MyChart E-visit questionnaire answers were reviewed by a board certified advanced clinical practitioner to complete your personal care plan based on your specific symptoms.  Thank you for using e-Visits.

## 2021-06-14 LAB — OB RESULTS CONSOLE RPR: RPR: NONREACTIVE

## 2021-06-14 LAB — OB RESULTS CONSOLE HEPATITIS B SURFACE ANTIGEN: Hepatitis B Surface Ag: NEGATIVE

## 2021-06-14 LAB — OB RESULTS CONSOLE HIV ANTIBODY (ROUTINE TESTING): HIV: NONREACTIVE

## 2021-06-14 LAB — OB RESULTS CONSOLE GC/CHLAMYDIA
Chlamydia: NEGATIVE
Gonorrhea: NEGATIVE

## 2021-06-14 LAB — OB RESULTS CONSOLE ABO/RH: RH Type: POSITIVE

## 2021-06-14 LAB — OB RESULTS CONSOLE RUBELLA ANTIBODY, IGM: Rubella: NON-IMMUNE/NOT IMMUNE

## 2021-06-14 LAB — OB RESULTS CONSOLE VARICELLA ZOSTER ANTIBODY, IGG: Varicella: NON-IMMUNE/NOT IMMUNE

## 2021-06-14 LAB — OB RESULTS CONSOLE ANTIBODY SCREEN: Antibody Screen: NEGATIVE

## 2021-10-03 ENCOUNTER — Encounter: Payer: Self-pay | Admitting: Neurology

## 2021-10-03 ENCOUNTER — Ambulatory Visit: Payer: Medicaid Other | Admitting: Neurology

## 2021-10-03 ENCOUNTER — Telehealth: Payer: Self-pay | Admitting: Neurology

## 2021-10-03 VITALS — BP 123/78 | HR 102 | Ht 65.0 in | Wt 212.0 lb

## 2021-10-03 DIAGNOSIS — R419 Unspecified symptoms and signs involving cognitive functions and awareness: Secondary | ICD-10-CM | POA: Diagnosis not present

## 2021-10-03 DIAGNOSIS — R42 Dizziness and giddiness: Secondary | ICD-10-CM

## 2021-10-03 DIAGNOSIS — R55 Syncope and collapse: Secondary | ICD-10-CM

## 2021-10-03 NOTE — Telephone Encounter (Signed)
[facility] community auth: [id number] (exp. 10/03/21 to 11/17/21) order sent to GI, they will reach out to the patient to schedule.

## 2021-10-03 NOTE — Patient Instructions (Addendum)
- MRI of the brain without contrast and EEG. She should get this at ED for repeat, in outpatient neuro we cannot get this urgently and the holidays will affect scheduling - Have to see primary care for cardiac evaluation or other causes or even for EKG, ASAP, should have been referred there first or at least along with neuro referral - No driving until workup complete - I recommend she call 911 if it happens again, she should have gotten urgent care at the ED who can provide immediate cardiac testing especially since she feels her heart racing and immediate brain imaging and other testing - Already cancelled multiple appointments here at [facility] in the past, if continues maynot be able to come back. But I encourage her to follow up with [place] for these spells, contact us for anymore, and also follow up with [place] for her migraines.   Syncope, Adult Syncope refers to a condition in which a person temporarily loses consciousness. Syncope may also be called fainting or passing out. It is caused by a sudden decrease in blood flow to the brain. This can happen for a variety of reasons. Most causes of syncope are not dangerous. It can be triggered by things such as needle sticks, seeing blood, pain, or intense emotion. However, syncope can also be a sign of a serious medical problem, such as a heart abnormality. Other causes can include dehydration, migraines, or taking medicines that lower blood pressure. Your health care provider may do tests to find the reason why you are having syncope. If you faint, get medical help right away. Call your local emergency services (911 in the [place] Follow these instructions at home: Pay attention to any changes in your symptoms. Take these actions to stay safe and to help relieve your symptoms: Knowing when you may be about to faint Signs that you may be about to faint include: Feeling dizzy, weak, light-headed, or like the room is spinning. Feeling nauseous. Seeing spots or  seeing all white or all [place] in your field of vision. Having cold, clammy skin or feeling warm and sweaty. Hearing ringing in the ears (tinnitus). If you start to feel like you might faint, sit or lie down right away. If sitting, put your head down between your legs. If lying down, raise (elevate) your feet above the level of your heart. Breathe deeply and steadily. Wait until all the symptoms have passed. Have someone stay with you until you feel stable. Medicines Take over-the-counter and prescription medicines only as told by your health care provider. If you are taking blood pressure or heart medicine, get up slowly and take several minutes to sit and then stand. This can reduce dizziness and decrease the risk of syncope. Lifestyle Do not drive, use machinery, or play sports until your health care provider says it is okay. Do not drink alcohol. Do not use any products that contain nicotine or tobacco. These products include cigarettes, chewing tobacco, and vaping devices, such as e-cigarettes. If you need help quitting, ask your health care provider. Avoid hot tubs and saunas. General instructions Talk with your health care provider about your symptoms. You may need to have testing to understand the cause of your syncope. Drink enough fluid to keep your urine pale yellow. Avoid prolonged standing. If you must stand for a long time, do movements such as: Moving your legs. Crossing your legs. Flexing and stretching your leg muscles. Squatting. Keep all follow-up visits. This is important. Contact a health care provider  if: You have episodes of near fainting. Get help right away if: You faint. You hit your head or are injured after fainting. You have any of these symptoms that may indicate trouble with your heart: Fast or irregular heartbeats (palpitations). Unusual pain in your chest, abdomen, or back. Shortness of breath. You have a seizure. You have a severe headache. You are  confused. You have vision problems. You have severe weakness or trouble walking. You are bleeding from your mouth or rectum, or you have [place] or tarry stool. These symptoms may represent a serious problem that is an emergency. Do not wait to see if your symptoms will go away. Get medical help right away. Call your local emergency services (911 in the [place] Do not drive yourself to the hospital. Summary Syncope refers to a condition in which a person temporarily loses consciousness. Syncope may also be called fainting or passing out. It is caused by a sudden decrease in blood flow to the brain. Signs that you may be about to faint include dizziness, feeling light-headed, feeling nauseous, sudden vision changes, or cold, clammy skin. Even though most causes of syncope are not dangerous, syncope can be a sign of a serious medical problem. Get help right away if you faint. If you start to feel like you might faint, sit or lie down right away. If sitting, put your head down between your legs. If lying down, raise (elevate) your feet above the level of your heart. This information is not intended to replace advice given to you by your health care provider. Make sure you discuss any questions you have with your health care provider. Document Revised: 02/08/2021 Document Reviewed: 02/08/2021 Elsevier Patient Education  2022 [employer].

## 2021-10-03 NOTE — Progress Notes (Addendum)
[facility]    Provider:  Dr Lucia Gaskins Requesting Provider: Huel Cote, MD Primary Care Provider:  Mendel Ryder, PA-C  CC:  syncope  HPI:  [name] is a 25 y.o. female here as requested by Huel Cote, MD for syncopal episodes. She is [redacted] weeks pregnant.  Past medical history of anxiety.  She is here for evaluation of neurologic causes of syncopal episodes.  I reviewed [name]'s notes, she has episodes of ringing in the ears and blurred vision and passes out x3 in the last month and a half, she states she is falling down but not injure anything, it then takes her a few hours to get back to normal feels weak and out of it, eating and drinking appropriately she thinks, has been feeling more anxious, does not feel stable sometimes, she is depressed, no thoughts of hurting herself but has needed meds for this in the past, was on Lexapro and BuSpar in the past nothing in over a year.  I did discuss getting a cardiology work-up but that would have to be discussed with Dr. Senaida Ores who is her referring physician or her primary care.   She doesn't take ibuprofen as much or as drastically as she did. She never followed for her migraines I saw her in 2020. She still has migraines and we will have to deal at a later time, she came in late for her appointment. Still having migraines, she did not follow up. Started a little over a month ago, starts with ears ringing, lightheaded, dizzy, feels her world coming in on her, she was in the shower the first time. She slowly loses consciousness in slow motion, if she sits down she doesn't pass out. She has not had an EKG or orthostatics. No loss of bowel or bladder. No abnormal movements. No postictal confusion. It has happened 4x. She should not be driving if she gets dizzy even when sitting. No other focal neurologic deficits, associated symptoms, inciting events or modifiable factors.  Reviewed notes, labs and imaging  from outside physicians, which showed see above  Review of Systems: Patient complains of symptoms per HPI as well as the following symptoms passing out. Pertinent negatives and positives per HPI. All others negative.   Social History   Socioeconomic History   Marital status: Single    Spouse name: Not on file   Number of children: Not on file   Years of education: Not on file   Highest education level: Not on file  Occupational History   Not on file  Tobacco Use   Smoking status: Never   Smokeless tobacco: Never  Vaping Use   Vaping Use: Never used  Substance and Sexual Activity   Alcohol use: Not Currently    Comment: occasionally   Drug use: Not Currently    Types: Marijuana   Sexual activity: Yes    Birth control/protection: I.U.D., Condom  Other Topics Concern   Not on file  Social History Narrative   Lives fiance, 1 son, (42yr).    Caffeine twice week,  Working Arts development officer,           ** Merged History Encounter **       ** Merged History Encounter **       ** Data from: 04/02/14 Enc Dept: [facility]EMERGENCY DEPT   10th grade       ** Data from: 06/02/14 Enc Dept: [facility]EMER[facility]ENC[facility] DE[facility]   Lives at home with mother, brother and MGM. Spends some time at  step father's house. Secondary tobacco exposure. Pt admits to marijuana use, history of alcohol use, no other drug use or tobacco use.    Social Determinants of Health   Financial Resource Strain: Not on file  Food Insecurity: Not on file  Transportation Needs: Not on file  Physical Activity: Not on file  Stress: Not on file  Social Connections: Not on file  Intimate Partner Violence: Not on file    Family History  Problem Relation Age of Onset   Arthritis Maternal Grandmother    Hearing loss Maternal Grandmother    Diabetes Maternal Grandmother    Cancer Maternal Grandfather    Bipolar disorder Brother    Cancer Father    Migraines Neg Hx     Past Medical History:  Diagnosis Date   Abdominal pain    Allergy     Anxiety    Constipation    Diarrhea    Headache(784.0)    HSV (herpes simplex virus) anogenital infection    Proctalgia    Yeast infection     Patient Active Problem List   Diagnosis Date Noted   Chronic migraine without aura, with intractable migraine, so stated, with status migrainosus 08/01/2019   Long term use of drug 08/01/2019   Medication overuse headache 08/01/2019   SVD (spontaneous vaginal delivery) 03/14/2017   Indication for care in labor or delivery 03/13/2017   Nausea & vomiting 06/02/2014   Anxiety 06/02/2014   Nausea and vomiting 06/02/2014   Intestinal bacterial overgrowth 08/03/2012   Pyrosis 07/09/2012   Generalized abdominal pain    Appendicitis, acute 08/18/2011   Constipation    Proctalgia     Past Surgical History:  Procedure Laterality Date   APPENDECTOMY  08/18/11   APPENDECTOMY     birth control implant in arm     Hx: of   ESOPHAGOGASTRODUODENOSCOPY N/A 02/19/2013   Procedure: ESOPHAGOGASTRODUODENOSCOPY (EGD);  Surgeon: Jon Gills, MD;  Location: [facility] OR;  Service: Gastroenterology;  Laterality: N/A;   LAPAROSCOPIC APPENDECTOMY  08/18/2011   Procedure: APPENDECTOMY LAPAROSCOPIC;  Surgeon: Judie Petit. Leonia Corona, MD;  Location: [facility] OR;  Service: Pediatrics;;    Current Outpatient Medications  Medication Sig Dispense Refill   Prenatal Vit-Fe Fumarate-FA (PRENATAL MULTIVITAMIN) TABS tablet Take 1 tablet by mouth daily at 12 noon.     No current facility-administered medications for this visit.    Allergies as of 10/03/2021 - Review Complete 10/03/2021  Allergen Reaction Noted   Amoxicillin Hives 08/18/2011   Penicillins Hives 02/05/2011   Phenergan [promethazine] Nausea And Vomiting and Other (See Comments) 11/18/2015   Penicillins  11/23/2015    Vitals: BP 123/78    Pulse (!) 102    Ht 5\' 5"  (1.651 m)    Wt 212 lb (96.2 kg)    LMP  (LMP Unknown)    BMI 35.28 kg/m  Last Weight:  Wt Readings from Last 1 Encounters:  10/03/21 212 lb (96.2  kg)   Last Height:   Ht Readings from Last 1 Encounters:  10/03/21 5\' 5"  (1.651 m)     Physical exam: Exam: Gen: NAD, conversant, well nourised, well groomed                     CV: RRR, no MRG. No Carotid Bruits. No peripheral edema, warm, nontender Eyes: Conjunctivae clear without exudates or hemorrhage  Neuro: Detailed Neurologic Exam  Speech:    Speech is normal; fluent and spontaneous with normal comprehension.  Cognition:  The patient is oriented to person, place, and time;     recent and remote memory intact;     language fluent;     normal attention, concentration,     fund of knowledge Cranial Nerves:    The pupils are equal, round, and reactive to light. Pupils too small to visualize fundi Visual fields are full to finger confrontation. Extraocular movements are intact. Trigeminal sensation is intact and the muscles of mastication are normal. The face is symmetric. The palate elevates in the midline. Hearing intact. Voice is normal. Shoulder shrug is normal. The tongue has normal motion without fasciculations.   Coordination:    Normal  Gait:    Lordotic (6 months pregnant)  Motor Observation:    No asymmetry, no atrophy, and no involuntary movements noted. Tone:    Normal muscle tone.    Posture:    Posture is normal. normal erect    Strength:    Strength is V/V in the upper and lower limbs.      Sensation: intact to LT     Reflex Exam:  DTR's:    Deep tendon reflexes in the upper and lower extremities are symmetrical bilaterally.   Toes:    The toes are downgoing bilaterally.   Clonus:    Clonus is absent.    Assessment/Plan:  Patient sent sent directly here from obgyn for syncopal episodes. Sound like orthostatic or vasovagal but needs a thorough eval. Has not had EKG or cardiac workup or even orthostatics and hasn't even been sent to primary care which should have been first evaluation. She has stable migraines but this is not the cause of these  new syncopal episodes. I contacted Dr. Senaida Ores and explained the referral pathway should have been different but we do appreciate her care and concern of patient. I saw patient for migraines in 2020 and she did not follow up; in fact she cancelled follow up with me and no-showed for MR brain (cancelled within 24 hours) in 2020. we will have to address migraines at a later appointment  as she also came late (past her appointment time) today so we have to stay focused on syncope as the major new issue. Migraines have remained stable, no changes or new symptoms. Neuro exam is non focal.   - No driving until workup complete - Needs pcp ASAP for EKG and thorough evaluation as clinically warranted by pcp - Sounds orthostatic or vasovagal but needs thorough eval for many disorders than can occur in pregnancy affecting the brain - MRI of the brain without contrast and EEG. She should get this at ED for repeat, in outpatient neuro we cannot get this urgently and the holidays will affect scheduling - She should have called 911, and I recommend she call 911 if it happens again, she should have gotten urgent care at the ED who can provide immediate cardiac testing especially since she feels her heart racing and immediate brain imaging and other testing - Discussed compliance, if she no shows again or does not follow medical advice she will be dismissed from our practice per office policy - orthostatic vital signs and blood work today  Orders Placed This Encounter  Procedures   MR BRAIN WO CONTRAST   CBC with Differential/Platelets   Comprehensive metabolic panel   TSH   EEG adult   No orders of the defined types were placed in this encounter.   Cc: Huel Cote, MD,  Mendel Ryder, PA-C  Naomie Dean, MD  [facility] [address] [place], [place] [address]  Phone [phone] Fax [phone]

## 2021-10-04 ENCOUNTER — Other Ambulatory Visit: Payer: Self-pay

## 2021-10-04 ENCOUNTER — Ambulatory Visit
Admission: RE | Admit: 2021-10-04 | Discharge: 2021-10-04 | Disposition: A | Discharge status: Home / Self Care | Payer: Medicaid Other | Source: Ambulatory Visit | Attending: Neurology | Admitting: Neurology

## 2021-10-04 DIAGNOSIS — R419 Unspecified symptoms and signs involving cognitive functions and awareness: Secondary | ICD-10-CM

## 2021-10-04 DIAGNOSIS — R55 Syncope and collapse: Secondary | ICD-10-CM | POA: Diagnosis not present

## 2021-10-04 DIAGNOSIS — R42 Dizziness and giddiness: Secondary | ICD-10-CM | POA: Diagnosis not present

## 2021-10-04 LAB — CBC WITH DIFFERENTIAL/PLATELET
Basophils Absolute: 0 10*3/uL (ref 0.0–0.2)
Basos: 0 %
EOS (ABSOLUTE): 0.1 10*3/uL (ref 0.0–0.4)
Eos: 1 %
Hematocrit: 35.9 % (ref 34.0–46.6)
Hemoglobin: 12.4 g/dL (ref 11.1–15.9)
Immature Grans (Abs): 0 10*3/uL (ref 0.0–0.1)
Immature Granulocytes: 0 %
Lymphocytes Absolute: 1.7 10*3/uL (ref 0.7–3.1)
Lymphs: 17 %
MCH: 30 pg (ref 26.6–33.0)
MCHC: 34.5 g/dL (ref 31.5–35.7)
MCV: 87 fL (ref 79–97)
Monocytes Absolute: 0.4 10*3/uL (ref 0.1–0.9)
Monocytes: 4 %
Neutrophils Absolute: 7.6 10*3/uL — ABNORMAL HIGH (ref 1.4–7.0)
Neutrophils: 78 %
Platelets: 230 10*3/uL (ref 150–450)
RBC: 4.13 x10E6/uL (ref 3.77–5.28)
RDW: 12 % (ref 11.7–15.4)
WBC: 9.9 10*3/uL (ref 3.4–10.8)

## 2021-10-04 LAB — COMPREHENSIVE METABOLIC PANEL
ALT: 12 IU/L (ref 0–32)
AST: 11 IU/L (ref 0–40)
Albumin/Globulin Ratio: 1.7 (ref 1.2–2.2)
Albumin: 3.6 g/dL — ABNORMAL LOW (ref 3.9–5.0)
Alkaline Phosphatase: 126 IU/L — ABNORMAL HIGH (ref 44–121)
BUN/Creatinine Ratio: 14 (ref 9–23)
BUN: 7 mg/dL (ref 6–20)
Bilirubin Total: <0.2 mg/dL (ref 0.0–1.2)
CO2: 18 mmol/L — ABNORMAL LOW (ref 20–29)
Calcium: 8.7 mg/dL (ref 8.7–10.2)
Chloride: 103 mmol/L (ref 96–106)
Creatinine, Ser: 0.5 mg/dL — ABNORMAL LOW (ref 0.57–1.00)
Globulin, Total: 2.1 g/dL (ref 1.5–4.5)
Glucose: 101 mg/dL — ABNORMAL HIGH (ref 70–99)
Potassium: 4.4 mmol/L (ref 3.5–5.2)
Sodium: 138 mmol/L (ref 134–144)
Total Protein: 5.7 g/dL — ABNORMAL LOW (ref 6.0–8.5)
eGFR: 134 mL/min/{1.73_m2} (ref >59)

## 2021-10-04 LAB — TSH: TSH: 1.42 u[IU]/mL (ref 0.450–4.500)

## 2021-10-11 ENCOUNTER — Ambulatory Visit: Payer: Medicaid Other | Admitting: Neurology

## 2021-10-11 DIAGNOSIS — R55 Syncope and collapse: Secondary | ICD-10-CM

## 2021-10-11 DIAGNOSIS — R42 Dizziness and giddiness: Secondary | ICD-10-CM | POA: Diagnosis not present

## 2021-10-11 DIAGNOSIS — R419 Unspecified symptoms and signs involving cognitive functions and awareness: Secondary | ICD-10-CM

## 2021-10-12 NOTE — Procedures (Signed)
° ° °  History:  73 yof with syncope  EEG classification:  Awake and asleep  Description of the recording: The background rhythms of this recording consists of a fairly well modulated medium amplitude background activity of 9-10 Hz. As the record progresses, the patient initially is in the waking state, but appears to enter the early stage II sleep during the recording, with rudimentary sleep spindles and vertex sharp wave activity seen. During the wakeful state, photic stimulation is performed, and no abnormal responses were seen. Hyperventilation was also performed, no abnormal response seen. No epileptiform discharges seen during this recording. There was no focal slowing. EKG monitor shows no evidence of cardiac rhythm abnormalities with a heart rate of 72.  Impression: This is a normal EEG recording in the waking and sleeping state. No evidence interictal epileptiform discharges were seen at any time during the recording.  A normal EEG does not exclude a diagnosis of epilepsy.    Windell Norfolk, MD [facility]

## 2021-10-14 NOTE — L&D Delivery Note (Signed)
Delivery Note ? ? ?Pt reached complete dilation and pushed great about 15 minutes.  At 4:55 PM a healthy female was delivered via Vaginal, Spontaneous (Presentation: Right Occiput Anterior).  APGAR: 8, 9; weight  .   ?Placenta status: Spontaneous, Intact.  Cord:   with the following complications:  none.   ?Anesthesia: Epidural ?Episiotomy: None ?Lacerations: 1st degree;Perineal ?Suture Repair: 3.0 vicryl rapide ?Est. Blood Loss (mL): 100 ? ?Mom to postpartum.  Baby to Couplet care / Skin to Skin. ? ?D/w pt circumcision and she does not think they desire but will d/w partner and let us know if changes mind. ? ?[name] ?12/19/2021, 5:12 PM ? ? ? ?

## 2021-10-18 ENCOUNTER — Telehealth: Payer: Self-pay

## 2021-10-18 NOTE — Telephone Encounter (Signed)
-----   Message from Anson Fret, MD sent at 10/16/2021  9:50 PM EST ----- Please discuss and remind her of seeing her pcp for cardiac evaluation:  EEG is normal. MRI was normal. I do not think these were seizures. But please remind her she needs to see primary care for a heart evaluation including EKG.

## 2021-10-18 NOTE — Telephone Encounter (Signed)
I called the pt and left a vm advising of results ( ok per dpr). Advised pt to check in with PCP to complete cardiac workup/evaluation.

## 2021-11-01 ENCOUNTER — Encounter (HOSPITAL_COMMUNITY): Payer: Self-pay | Admitting: Obstetrics

## 2021-11-01 ENCOUNTER — Inpatient Hospital Stay (HOSPITAL_COMMUNITY)
Admission: AD | Admit: 2021-11-01 | Discharge: 2021-11-01 | Disposition: A | Discharge status: Home / Self Care | Payer: Medicaid Other | Attending: Obstetrics | Admitting: Obstetrics

## 2021-11-01 DIAGNOSIS — Y939 Activity, unspecified: Secondary | ICD-10-CM | POA: Diagnosis not present

## 2021-11-01 DIAGNOSIS — Z3A32 32 weeks gestation of pregnancy: Secondary | ICD-10-CM | POA: Insufficient documentation

## 2021-11-01 DIAGNOSIS — Z3689 Encounter for other specified antenatal screening: Secondary | ICD-10-CM | POA: Diagnosis not present

## 2021-11-01 DIAGNOSIS — W109XXA Fall (on) (from) unspecified stairs and steps, initial encounter: Secondary | ICD-10-CM | POA: Insufficient documentation

## 2021-11-01 DIAGNOSIS — Y929 Unspecified place or not applicable: Secondary | ICD-10-CM | POA: Insufficient documentation

## 2021-11-01 DIAGNOSIS — W19XXXA Unspecified fall, initial encounter: Secondary | ICD-10-CM

## 2021-11-01 DIAGNOSIS — O368130 Decreased fetal movements, third trimester, not applicable or unspecified: Secondary | ICD-10-CM | POA: Insufficient documentation

## 2021-11-01 NOTE — Progress Notes (Signed)
Written and verbal d/c instructions given and understanding voiced. 

## 2021-11-01 NOTE — MAU Provider Note (Addendum)
History     CSN: [id number]  Arrival date and time: 11/01/21 2044   Event Date/Time   First Provider Initiated Contact with Patient 11/01/21 2105      Chief Complaint  Patient presents with   Fall   25 y.o. G2P1001 @32 .2 wks presenting for decreased FM after a fall around 1pm this afternoon. Reports slipping on the stairs outside and fell forward landing on her hands and knees. No abdominal contact. Denies syncope. Thinks she slipped d/t rain. Reports some FM but less than usual. Denies VB, LOF, or ctx. Had some [place] earlier.    OB History     Gravida  2   Para  1   Term  1   Preterm  0   AB  0   Living  1      SAB  0   IAB  0   Ectopic  0   Multiple  0   Live Births  1           Past Medical History:  Diagnosis Date   Abdominal pain    Allergy    Anxiety    Constipation    Diarrhea    Headache(784.0)    HSV (herpes simplex virus) anogenital infection    Proctalgia    Yeast infection     Past Surgical History:  Procedure Laterality Date   APPENDECTOMY  08/18/11   APPENDECTOMY     birth control implant in arm     Hx: of   ESOPHAGOGASTRODUODENOSCOPY N/A 02/19/2013   Procedure: ESOPHAGOGASTRODUODENOSCOPY (EGD);  Surgeon: Oletha Blend, MD;  Location: [facility];  Service: Gastroenterology;  Laterality: N/A;   LAPAROSCOPIC APPENDECTOMY  08/18/2011   Procedure: APPENDECTOMY LAPAROSCOPIC;  Surgeon: Jerilynn Mages. Gerald Stabs, MD;  Location: [facility] OR;  Service: Pediatrics;;    Family History  Problem Relation Age of Onset   Arthritis Maternal Grandmother    Hearing loss Maternal Grandmother    Diabetes Maternal Grandmother    Cancer Maternal Grandfather    Bipolar disorder Brother    Cancer Father    Migraines Neg Hx     Social History   Tobacco Use   Smoking status: Never   Smokeless tobacco: Never  Vaping Use   Vaping Use: Never used  Substance Use Topics   Alcohol use: Not Currently    Comment: occasionally   Drug use: Not Currently    Types:  Marijuana    Allergies:  Allergies  Allergen Reactions   Amoxicillin Hives   Penicillins Hives    Has patient had a PCN reaction causing immediate rash, facial/tongue/throat swelling, SOB or lightheadedness with hypotension: Yes Has patient had a PCN reaction causing severe rash involving mucus membranes or skin necrosis: Yes Has patient had a PCN reaction that required hospitalization unknown Has patient had a PCN reaction occurring within the last 10 years: unknown If all of the above answers are "NO", then may proceed with Cephalosporin use.    Phenergan [Promethazine] Nausea And Vomiting and Other (See Comments)    Doesn't work for patient   Penicillins     Hives, vomiting    Medications Prior to Admission  Medication Sig Dispense Refill Last Dose   Prenatal Vit-Fe Fumarate-FA (PRENATAL MULTIVITAMIN) TABS tablet Take 1 tablet by mouth daily at 12 noon.       Review of Systems  Gastrointestinal:  Negative for abdominal pain.  Genitourinary:  Negative for vaginal bleeding and vaginal discharge.  Physical Exam   Blood pressure  129/72, pulse 80, temperature 98.2 F (36.8 C), resp. rate 17, height 5\' 5"  (1.651 m), weight 98 kg, SpO2 100 %, unknown if currently breastfeeding.  Physical Exam Vitals and nursing note reviewed.  Constitutional:      General: She is not in acute distress.    Appearance: Normal appearance.  HENT:     Head: Normocephalic and atraumatic.  Cardiovascular:     Rate and Rhythm: Normal rate.  Pulmonary:     Effort: Pulmonary effort is normal. No respiratory distress.  Abdominal:     Palpations: Abdomen is soft.     Tenderness: There is no abdominal tenderness.     Comments: gravid  Musculoskeletal:        General: Normal range of motion.     Cervical back: Normal range of motion.  Skin:    General: Skin is warm and dry.  Neurological:     General: No focal deficit present.     Mental Status: She is alert and oriented to person, place, and  time.  Psychiatric:        Mood and Affect: Mood normal.        Behavior: Behavior normal.  EFM: 140 bpm, mod variability, + accels, no decels Toco: none  No results found for this or any previous visit (from the past 24 hour(s)).  MAU Course  Procedures   MDM NST reactive. Pt feeling excellent FM since EFM applied, FM audible. No signs of PTL or abruption. Consult with Dr. Roselie Awkward, ok for discharge.   Assessment and Plan   1. [redacted] weeks gestation of pregnancy   2. NST (non-stress test) reactive   3. Fall, initial encounter    Discharge home Follow up at [facility] as scheduled [place] Abruption precautions  Allergies as of 11/01/2021       Reactions   Amoxicillin Hives   Penicillins Hives   Has patient had a PCN reaction causing immediate rash, facial/tongue/throat swelling, SOB or lightheadedness with hypotension: Yes Has patient had a PCN reaction causing severe rash involving mucus membranes or skin necrosis: Yes Has patient had a PCN reaction that required hospitalization unknown Has patient had a PCN reaction occurring within the last 10 years: unknown If all of the above answers are "NO", then may proceed with Cephalosporin use.   Phenergan [promethazine] Nausea And Vomiting, Other (See Comments)   Doesn't work for patient   Penicillins    Hives, vomiting        Medication List     TAKE these medications    prenatal multivitamin Tabs tablet Take 1 tablet by mouth daily at 12 noon.       Julianne Handler, CNM 11/01/2021, 10:01 PM

## 2021-11-01 NOTE — MAU Note (Signed)
I fell down some steps at 1300 today. I fell forward down 4 steps onto my hands and knees. Did not hit my abdomen. Denies VB or LOF. Some decreased FM since the fall. Some braxton hicks earlier but no pain now.

## 2021-11-27 LAB — OB RESULTS CONSOLE GBS: GBS: POSITIVE

## 2021-12-05 ENCOUNTER — Encounter: Payer: Self-pay | Admitting: Neurology

## 2021-12-05 ENCOUNTER — Telehealth: Payer: Self-pay

## 2021-12-05 ENCOUNTER — Ambulatory Visit: Payer: Medicaid Other | Admitting: Neurology

## 2021-12-05 NOTE — Telephone Encounter (Signed)
I called the pt and left a vm about possibly changing her 1 pm appt today with Dr. Jaynee Eagles to a virtual appt.  Pt advised to call back if she would like to do so.

## 2021-12-05 NOTE — Progress Notes (Unsigned)
[facility]    Provider:  Dr Lucia Gaskins Requesting Provider: Georgana Curio Primary Care Provider:  Mendel Ryder, PA-C  CC:  syncope  HPI:  [name] is a 26 y.o. female here as requested by Mendel Ryder, PA-C for syncopal episodes. She is [redacted] weeks pregnant.  Past medical history of anxiety.  She is here for evaluation of neurologic causes of syncopal episodes.  I reviewed [name]'s notes, she has episodes of ringing in the ears and blurred vision and passes out x3 in the last month and a half, she states she is falling down but not injure anything, it then takes her a few hours to get back to normal feels weak and out of it, eating and drinking appropriately she thinks, has been feeling more anxious, does not feel stable sometimes, she is depressed, no thoughts of hurting herself but has needed meds for this in the past, was on Lexapro and BuSpar in the past nothing in over a year.  I did discuss getting a cardiology work-up but that would have to be discussed with Dr. Senaida Ores who is her referring physician or her primary care.   She doesn't take ibuprofen as much or as drastically as she did. She never followed for her migraines I saw her in 2020. She still has migraines and we will have to deal at a later time, she came in late for her appointment. Still having migraines, she did not follow up. Started a little over a month ago, starts with ears ringing, lightheaded, dizzy, feels her world coming in on her, she was in the shower the first time. She slowly loses consciousness in slow motion, if she sits down she doesn't pass out. She has not had an EKG or orthostatics. No loss of bowel or bladder. No abnormal movements. No postictal confusion. It has happened 4x. She should not be driving if she gets dizzy even when sitting. No other focal neurologic deficits, associated symptoms, inciting events or modifiable factors.  Reviewed notes, labs and imaging from  outside physicians, which showed see above  Review of Systems: Patient complains of symptoms per HPI as well as the following symptoms passing out. Pertinent negatives and positives per HPI. All others negative.   Social History   Socioeconomic History   Marital status: Single    Spouse name: Not on file   Number of children: Not on file   Years of education: Not on file   Highest education level: Not on file  Occupational History   Not on file  Tobacco Use   Smoking status: Never   Smokeless tobacco: Never  Vaping Use   Vaping Use: Never used  Substance and Sexual Activity   Alcohol use: Not Currently    Comment: occasionally   Drug use: Not Currently    Types: Marijuana   Sexual activity: Yes    Birth control/protection: Condom  Other Topics Concern   Not on file  Social History Narrative   Lives fiance, 1 son, (71yr).    Caffeine twice week,  Working Arts development officer,           ** Merged History Encounter **       ** Merged History Encounter **       ** Data from: 04/02/14 Enc Dept: [facility]-EMERGENCY DEPT   10th grade       ** Data from: 06/02/14 Enc Dept: [facility]EMERGENCY DEPT   Lives at home with mother, brother and MGM. Spends some time at step  father's house. Secondary tobacco exposure. Pt admits to marijuana use, history of alcohol use, no other drug use or tobacco use.    Social Determinants of Health   Financial Resource Strain: Not on file  Food Insecurity: Not on file  Transportation Needs: Not on file  Physical Activity: Not on file  Stress: Not on file  Social Connections: Not on file  Intimate Partner Violence: Not on file    Family History  Problem Relation Age of Onset   Arthritis Maternal Grandmother    Hearing loss Maternal Grandmother    Diabetes Maternal Grandmother    Cancer Maternal Grandfather    Bipolar disorder Brother    Cancer Father    Migraines Neg Hx     Past Medical History:  Diagnosis Date   Abdominal pain    Allergy    Anxiety     Constipation    Diarrhea    Headache(784.0)    HSV (herpes simplex virus) anogenital infection    Proctalgia    Yeast infection     Patient Active Problem List   Diagnosis Date Noted   Chronic migraine without aura, with intractable migraine, so stated, with status migrainosus 08/01/2019   Long term use of drug 08/01/2019   Medication overuse headache 08/01/2019   SVD (spontaneous vaginal delivery) 03/14/2017   Indication for care in labor or delivery 03/13/2017   Nausea & vomiting 06/02/2014   Anxiety 06/02/2014   Nausea and vomiting 06/02/2014   Intestinal bacterial overgrowth 08/03/2012   Pyrosis 07/09/2012   Generalized abdominal pain    Appendicitis, acute 08/18/2011   Constipation    Proctalgia     Past Surgical History:  Procedure Laterality Date   APPENDECTOMY  08/18/11   APPENDECTOMY     birth control implant in arm     Hx: of   ESOPHAGOGASTRODUODENOSCOPY N/A 02/19/2013   Procedure: ESOPHAGOGASTRODUODENOSCOPY (EGD);  Surgeon: Oletha Blend, MD;  Location: [place];  Service: Gastroenterology;  Laterality: N/A;   LAPAROSCOPIC APPENDECTOMY  08/18/2011   Procedure: APPENDECTOMY LAPAROSCOPIC;  Surgeon: Jerilynn Mages. Gerald Stabs, MD;  Location: [facility] OR;  Service: Pediatrics;;    Current Outpatient Medications  Medication Sig Dispense Refill   Prenatal Vit-Fe Fumarate-FA (PRENATAL MULTIVITAMIN) TABS tablet Take 1 tablet by mouth daily at 12 noon.     No current facility-administered medications for this visit.    Allergies as of 12/05/2021 - Review Complete 11/01/2021  Allergen Reaction Noted   Amoxicillin Hives 08/18/2011   Penicillins Hives 02/05/2011   Phenergan [promethazine] Nausea And Vomiting and Other (See Comments) 11/18/2015   Penicillins  11/23/2015    Vitals: LMP  (LMP Unknown)  Last Weight:  Wt Readings from Last 1 Encounters:  11/01/21 216 lb (98 kg)   Last Height:   Ht Readings from Last 1 Encounters:  11/01/21 5\' 5"  (1.651 m)     Physical  exam: Exam: Gen: NAD, conversant, well nourised, well groomed                     CV: RRR, no MRG. No Carotid Bruits. No peripheral edema, warm, nontender Eyes: Conjunctivae clear without exudates or hemorrhage  Neuro: Detailed Neurologic Exam  Speech:    Speech is normal; fluent and spontaneous with normal comprehension.  Cognition:    The patient is oriented to person, place, and time;     recent and remote memory intact;     language fluent;     normal attention, concentration,  fund of knowledge Cranial Nerves:    The pupils are equal, round, and reactive to light. Pupils too small to visualize fundi Visual fields are full to finger confrontation. Extraocular movements are intact. Trigeminal sensation is intact and the muscles of mastication are normal. The face is symmetric. The palate elevates in the midline. Hearing intact. Voice is normal. Shoulder shrug is normal. The tongue has normal motion without fasciculations.   Coordination:    Normal  Gait:    Lordotic (6 months pregnant)  Motor Observation:    No asymmetry, no atrophy, and no involuntary movements noted. Tone:    Normal muscle tone.    Posture:    Posture is normal. normal erect    Strength:    Strength is V/V in the upper and lower limbs.      Sensation: intact to LT     Reflex Exam:  DTR's:    Deep tendon reflexes in the upper and lower extremities are symmetrical bilaterally.   Toes:    The toes are downgoing bilaterally.   Clonus:    Clonus is absent.    Assessment/Plan:  Patient sent sent directly here from obgyn for syncopal episodes. Sound like orthostatic or vasovagal but needs a thorough eval. Has not had EKG or cardiac workup or even orthostatics and hasn't even been sent to primary care which should have been first evaluation. She has stable migraines but this is not the cause of these new syncopal episodes. I contacted Dr. Marvel Plan and explained the referral pathway should have been  different but we do appreciate her care and concern of patient. I saw patient for migraines in 2020 and she did not follow up; in fact she cancelled follow up with me and no-showed for MR brain (cancelled within 24 hours) in 2020. we will have to address migraines at a later appointment  as she also came late (past her appointment time) today so we have to stay focused on syncope as the major new issue. Migraines have remained stable, no changes or new symptoms. Neuro exam is non focal.   - No driving until workup complete - Needs pcp ASAP for EKG and thorough evaluation as clinically warranted by pcp - Sounds orthostatic or vasovagal but needs thorough eval for many disorders than can occur in pregnancy affecting the brain - MRI of the brain without contrast and EEG. She should get this at ED for repeat, in outpatient neuro we cannot get this urgently and the holidays will affect scheduling - She should have called 911, and I recommend she call 911 if it happens again, she should have gotten urgent care at the ED who can provide immediate cardiac testing especially since she feels her heart racing and immediate brain imaging and other testing - Discussed compliance, if she no shows again or does not follow medical advice she will be dismissed from our practice per office policy - orthostatic vital signs and blood work today  No orders of the defined types were placed in this encounter.  No orders of the defined types were placed in this encounter.   Cc: Romeo Apple, PA-C  [facility] Ill, [facility] [address] [place],  [address]  Phone [phone] Fax [phone]

## 2021-12-06 ENCOUNTER — Encounter: Payer: Self-pay | Admitting: Neurology

## 2021-12-12 ENCOUNTER — Telehealth (HOSPITAL_COMMUNITY): Payer: Self-pay | Admitting: *Deleted

## 2021-12-12 NOTE — Telephone Encounter (Signed)
Preadmission screen  

## 2021-12-13 ENCOUNTER — Encounter (HOSPITAL_COMMUNITY): Payer: Self-pay | Admitting: *Deleted

## 2021-12-13 ENCOUNTER — Telehealth (HOSPITAL_COMMUNITY): Payer: Self-pay | Admitting: *Deleted

## 2021-12-13 NOTE — Telephone Encounter (Signed)
Preadmission screen  

## 2021-12-18 ENCOUNTER — Encounter (HOSPITAL_COMMUNITY): Payer: Self-pay | Admitting: Obstetrics and Gynecology

## 2021-12-18 ENCOUNTER — Other Ambulatory Visit: Payer: Self-pay | Admitting: Obstetrics and Gynecology

## 2021-12-18 NOTE — H&P (Signed)
[name] is a 26 y.o. female G2P1001 at 15 1/7 weeks (EDD 12/25/21 by 12 week [place] and unknown LMP) presenting for elective IOL at term. ?Prenatal care significnat for: ?1) HSV-- ?ppx at 36wks ?2) Anxiety disorder  ?on meds in past, nothing in one year. Reported at 27 weeks feeling worse and anxiety not good--referred for mental health evaluation and awaiting pairing with in-person psych due to self-reported intolerance to many psych meds ?3) Syncope  ?Pt seen by neuro for episodes of blacking out, they feel syncope   MRI and EEG WNL ?PCP did EKG ? ?OB History   ? ? Gravida  ?2  ? Para  ?1  ? Term  ?1  ? Preterm  ?0  ? AB  ?0  ? Living  ?1  ?  ? ? SAB  ?0  ? IAB  ?0  ? Ectopic  ?0  ? Multiple  ?0  ? Live Births  ?1  ?   ?  ?  ?03-14-2017, 39.3 wks ?M, 7lbs 1oz, NSVD ? ?Past Medical History:  ?Diagnosis Date  ? Abdominal pain   ? Allergy   ? Anxiety   ? Constipation   ? Depression   ? Diarrhea   ? Headache(784.0)   ? HSV (herpes simplex virus) anogenital infection   ? Proctalgia   ? Syncope   ? Yeast infection   ? ?Past Surgical History:  ?Procedure Laterality Date  ? APPENDECTOMY  08/18/2011  ? birth control implant in arm    ? Hx: of  ? ESOPHAGOGASTRODUODENOSCOPY N/A 02/19/2013  ? Procedure: ESOPHAGOGASTRODUODENOSCOPY (EGD);  Surgeon: Jon Gills, MD;  Location: [facility] OR;  Service: Gastroenterology;  Laterality: N/A;  ? LAPAROSCOPIC APPENDECTOMY  08/18/2011  ? Procedure: APPENDECTOMY LAPAROSCOPIC;  Surgeon: Judie Petit. Leonia Corona, MD;  Location: [facility] OR;  Service: Pediatrics;;  ? ?Family History: family history includes Arthritis in her maternal grandmother; Bipolar disorder in her brother; Cancer in her father and maternal grandfather; Diabetes in her maternal grandmother; Hearing loss in her maternal grandmother. ?Social History:  reports that she has never smoked. She has never used smokeless tobacco. She reports that she does not currently use alcohol. She reports that she does not currently use drugs after having  used the following drugs: Marijuana. ? ? ?  ?Maternal Diabetes: No ?Genetic Screening: Declined ?Maternal Ultrasounds/Referrals: Normal ?Fetal Ultrasounds or other Referrals:  None ?Maternal Substance Abuse:  No ?Significant Maternal Medications:  Meds include: Other:  ?Significant Maternal Lab Results:  Group B Strep positive ?Other Comments:  None ? ?Review of Systems  ?Constitutional:  Negative for fever.  ?Genitourinary:  Negative for pelvic pain and vaginal bleeding.  ?Maternal Medical History:  ?Contractions: Frequency: irregular.   ?Perceived severity is mild.   ?Fetal activity: Perceived fetal activity is normal.   ?Prenatal complications: HSV, anxiety, palpitations ?Prenatal Complications - Diabetes: none. ? ?  ?unknown if currently breastfeeding. ?Maternal Exam:  ?Uterine Assessment: Contraction strength is mild.  Contraction frequency is irregular.  ?Abdomen: Patient reports no abdominal tenderness. Fetal presentation: vertex ?Introitus: Normal vulva.  ?Physical Exam ?Cardiovascular:  ?   Rate and Rhythm: Normal rate and regular rhythm.  ?Pulmonary:  ?   Effort: Pulmonary effort is normal.  ?Abdominal:  ?   Palpations: Abdomen is soft.  ?Genitourinary: ?   General: Normal vulva.  ?Neurological:  ?   Mental Status: She is alert.  ?Psychiatric:     ?   Mood and Affect: Mood normal.  ?  ?Prenatal labs: ?  ABO, Rh: B/Positive/-- (09/01 0000) ?Antibody: Negative (09/01 0000) ?Rubella: Nonimmune (09/01 0000) ?RPR: Nonreactive (09/01 0000)  ?HBsAg: Negative (09/01 0000)  ?HIV: Non-reactive (09/01 0000)  ?GBS: Positive/-- (02/14 0000)  ?One hour GCT 97 ?Assessment/Plan: ?Pt for IOL at term.    ?Plan pitocin and AROM after GBS prophylaxis on board ?Oliver Pila ?12/18/2021, 10:41 PM ? ? ? ? ?

## 2021-12-19 ENCOUNTER — Inpatient Hospital Stay (HOSPITAL_COMMUNITY): Payer: Medicaid Other

## 2021-12-19 ENCOUNTER — Inpatient Hospital Stay (HOSPITAL_COMMUNITY)
Admission: AD | Admit: 2021-12-19 | Discharge: 2021-12-21 | DRG: 807 | Disposition: A | Discharge status: Home / Self Care | Payer: Medicaid Other | Attending: Obstetrics and Gynecology | Admitting: Obstetrics and Gynecology

## 2021-12-19 ENCOUNTER — Encounter (HOSPITAL_COMMUNITY): Payer: Self-pay | Admitting: Obstetrics and Gynecology

## 2021-12-19 ENCOUNTER — Inpatient Hospital Stay (HOSPITAL_COMMUNITY): Payer: Medicaid Other | Admitting: Anesthesiology

## 2021-12-19 ENCOUNTER — Other Ambulatory Visit: Payer: Self-pay

## 2021-12-19 DIAGNOSIS — Z3A39 39 weeks gestation of pregnancy: Secondary | ICD-10-CM | POA: Diagnosis not present

## 2021-12-19 DIAGNOSIS — Z20822 Contact with and (suspected) exposure to covid-19: Secondary | ICD-10-CM | POA: Diagnosis present

## 2021-12-19 DIAGNOSIS — O26893 Other specified pregnancy related conditions, third trimester: Principal | ICD-10-CM | POA: Diagnosis present

## 2021-12-19 DIAGNOSIS — Z3490 Encounter for supervision of normal pregnancy, unspecified, unspecified trimester: Principal | ICD-10-CM | POA: Diagnosis present

## 2021-12-19 DIAGNOSIS — O99824 Streptococcus B carrier state complicating childbirth: Secondary | ICD-10-CM | POA: Diagnosis present

## 2021-12-19 HISTORY — DX: Depression, unspecified: F32.A

## 2021-12-19 HISTORY — DX: Syncope and collapse: R55

## 2021-12-19 LAB — RPR: RPR Ser Ql: NONREACTIVE

## 2021-12-19 LAB — CBC
HCT: 34.9 % — ABNORMAL LOW (ref 36.0–46.0)
Hemoglobin: 11.7 g/dL — ABNORMAL LOW (ref 12.0–15.0)
MCH: 28.1 pg (ref 26.0–34.0)
MCHC: 33.5 g/dL (ref 30.0–36.0)
MCV: 83.9 fL (ref 80.0–100.0)
Platelets: 216 10*3/uL (ref 150–400)
RBC: 4.16 MIL/uL (ref 3.87–5.11)
RDW: 13.2 % (ref 11.5–15.5)
WBC: 10.7 10*3/uL — ABNORMAL HIGH (ref 4.0–10.5)
nRBC: 0 % (ref 0.0–0.2)

## 2021-12-19 LAB — RESP PANEL BY RT-PCR (FLU A&B, COVID) ARPGX2
Influenza A by PCR: NEGATIVE
Influenza B by PCR: NEGATIVE
SARS Coronavirus 2 by RT PCR: NEGATIVE

## 2021-12-19 LAB — TYPE AND SCREEN
ABO/RH(D): B POS
Antibody Screen: NEGATIVE

## 2021-12-19 MED ORDER — BUTORPHANOL TARTRATE 1 MG/ML IJ SOLN: 1.0000 mg | INTRAMUSCULAR | Status: DC | PRN

## 2021-12-19 MED ORDER — ACETAMINOPHEN 325 MG PO TABS
650.0000 mg | ORAL_TABLET | ORAL | Status: DC | PRN
  Administered 2021-12-19 – 2021-12-21 (×2): 650 mg via ORAL
  Filled 2021-12-19 (×2): qty 2

## 2021-12-19 MED ORDER — EPHEDRINE 5 MG/ML INJ: 10.0000 mg | INTRAVENOUS | Status: DC | PRN

## 2021-12-19 MED ORDER — SOD CITRATE-CITRIC ACID 500-334 MG/5ML PO SOLN: 30.0000 mL | ORAL | Status: DC | PRN

## 2021-12-19 MED ORDER — VANCOMYCIN HCL IN DEXTROSE 1-5 GM/200ML-% IV SOLN
1000.0000 mg | Freq: Two times a day (BID) | INTRAVENOUS | Status: DC
  Administered 2021-12-19: 1000 mg via INTRAVENOUS
  Filled 2021-12-19: qty 200

## 2021-12-19 MED ORDER — DIBUCAINE (PERIANAL) 1 % EX OINT: 1.0000 "application " | TOPICAL_OINTMENT | CUTANEOUS | Status: DC | PRN

## 2021-12-19 MED ORDER — PRENATAL MULTIVITAMIN CH
1.0000 | ORAL_TABLET | Freq: Every day | ORAL | Status: DC
  Administered 2021-12-20 – 2021-12-21 (×2): 1 via ORAL
  Filled 2021-12-19 (×2): qty 1

## 2021-12-19 MED ORDER — OXYCODONE-ACETAMINOPHEN 5-325 MG PO TABS: 1.0000 | ORAL_TABLET | ORAL | Status: DC | PRN

## 2021-12-19 MED ORDER — SENNOSIDES-DOCUSATE SODIUM 8.6-50 MG PO TABS
2.0000 | ORAL_TABLET | ORAL | Status: DC
  Administered 2021-12-19 – 2021-12-20 (×2): 2 via ORAL
  Filled 2021-12-19 (×3): qty 2

## 2021-12-19 MED ORDER — LACTATED RINGERS IV SOLN: 500.0000 mL | INTRAVENOUS | Status: DC | PRN

## 2021-12-19 MED ORDER — IBUPROFEN 600 MG PO TABS
600.0000 mg | ORAL_TABLET | Freq: Four times a day (QID) | ORAL | Status: DC
  Administered 2021-12-19 – 2021-12-21 (×7): 600 mg via ORAL
  Filled 2021-12-19 (×7): qty 1

## 2021-12-19 MED ORDER — LACTATED RINGERS IV SOLN: INTRAVENOUS | Status: DC

## 2021-12-19 MED ORDER — LIDOCAINE HCL (PF) 1 % IJ SOLN
INTRAMUSCULAR | Status: DC | PRN
  Administered 2021-12-19 (×2): 4 mL via EPIDURAL

## 2021-12-19 MED ORDER — ONDANSETRON HCL 4 MG/2ML IJ SOLN
4.0000 mg | Freq: Four times a day (QID) | INTRAMUSCULAR | Status: DC | PRN
  Administered 2021-12-19: 4 mg via INTRAVENOUS
  Filled 2021-12-19: qty 2

## 2021-12-19 MED ORDER — OXYTOCIN-SODIUM CHLORIDE 30-0.9 UT/500ML-% IV SOLN
2.5000 [IU]/h | INTRAVENOUS | Status: DC
  Administered 2021-12-19: 2.5 [IU]/h via INTRAVENOUS

## 2021-12-19 MED ORDER — PHENYLEPHRINE 40 MCG/ML (10ML) SYRINGE FOR IV PUSH (FOR BLOOD PRESSURE SUPPORT): 80.0000 ug | PREFILLED_SYRINGE | INTRAVENOUS | Status: DC | PRN

## 2021-12-19 MED ORDER — OXYTOCIN BOLUS FROM INFUSION
333.0000 mL | Freq: Once | INTRAVENOUS | Status: AC
  Administered 2021-12-19: 333 mL via INTRAVENOUS

## 2021-12-19 MED ORDER — DIPHENHYDRAMINE HCL 25 MG PO CAPS: 25.0000 mg | ORAL_CAPSULE | Freq: Four times a day (QID) | ORAL | Status: DC | PRN

## 2021-12-19 MED ORDER — ZOLPIDEM TARTRATE 5 MG PO TABS: 5.0000 mg | ORAL_TABLET | Freq: Every evening | ORAL | Status: DC | PRN

## 2021-12-19 MED ORDER — SIMETHICONE 80 MG PO CHEW: 80.0000 mg | CHEWABLE_TABLET | ORAL | Status: DC | PRN

## 2021-12-19 MED ORDER — LIDOCAINE HCL (PF) 1 % IJ SOLN: 30.0000 mL | INTRAMUSCULAR | Status: DC | PRN

## 2021-12-19 MED ORDER — OXYCODONE-ACETAMINOPHEN 5-325 MG PO TABS: 2.0000 | ORAL_TABLET | ORAL | Status: DC | PRN

## 2021-12-19 MED ORDER — ACETAMINOPHEN 325 MG PO TABS: 650.0000 mg | ORAL_TABLET | ORAL | Status: DC | PRN

## 2021-12-19 MED ORDER — COCONUT OIL OIL
1.0000 | TOPICAL_OIL | Status: DC | PRN
Start: 2021-12-19 — End: 2021-12-21

## 2021-12-19 MED ORDER — TETANUS-DIPHTH-ACELL PERTUSSIS 5-2.5-18.5 LF-MCG/0.5 IM SUSY: 0.5000 mL | PREFILLED_SYRINGE | Freq: Once | INTRAMUSCULAR | Status: DC

## 2021-12-19 MED ORDER — LACTATED RINGERS IV SOLN
500.0000 mL | Freq: Once | INTRAVENOUS | Status: AC
  Administered 2021-12-19: 500 mL via INTRAVENOUS

## 2021-12-19 MED ORDER — PHENYLEPHRINE 40 MCG/ML (10ML) SYRINGE FOR IV PUSH (FOR BLOOD PRESSURE SUPPORT)
80.0000 ug | PREFILLED_SYRINGE | INTRAVENOUS | Status: DC | PRN
  Filled 2021-12-19: qty 10

## 2021-12-19 MED ORDER — ONDANSETRON HCL 4 MG/2ML IJ SOLN: 4.0000 mg | INTRAMUSCULAR | Status: DC | PRN

## 2021-12-19 MED ORDER — ONDANSETRON HCL 4 MG PO TABS: 4.0000 mg | ORAL_TABLET | ORAL | Status: DC | PRN

## 2021-12-19 MED ORDER — TERBUTALINE SULFATE 1 MG/ML IJ SOLN: 0.2500 mg | Freq: Once | INTRAMUSCULAR | Status: DC | PRN

## 2021-12-19 MED ORDER — OXYTOCIN-SODIUM CHLORIDE 30-0.9 UT/500ML-% IV SOLN
1.0000 m[IU]/min | INTRAVENOUS | Status: DC
  Administered 2021-12-19: 2 m[IU]/min via INTRAVENOUS
  Filled 2021-12-19: qty 500

## 2021-12-19 MED ORDER — FENTANYL-BUPIVACAINE-NACL 0.5-0.125-0.9 MG/250ML-% EP SOLN
12.0000 mL/h | EPIDURAL | Status: DC | PRN
  Administered 2021-12-19: 12 mL/h via EPIDURAL
  Filled 2021-12-19: qty 250

## 2021-12-19 MED ORDER — BENZOCAINE-MENTHOL 20-0.5 % EX AERO
1.0000 "application " | INHALATION_SPRAY | CUTANEOUS | Status: DC | PRN
  Administered 2021-12-19: 1 via TOPICAL
  Filled 2021-12-19: qty 56

## 2021-12-19 MED ORDER — WITCH HAZEL-GLYCERIN EX PADS: 1.0000 "application " | MEDICATED_PAD | CUTANEOUS | Status: DC | PRN

## 2021-12-19 MED ORDER — DIPHENHYDRAMINE HCL 50 MG/ML IJ SOLN
12.5000 mg | INTRAMUSCULAR | Status: DC | PRN
  Administered 2021-12-19: 12.5 mg via INTRAVENOUS
  Filled 2021-12-19: qty 1

## 2021-12-19 NOTE — Lactation Note (Signed)
This note was copied from a baby's chart. ?Lactation Consultation Note ? ?Patient Name: Boy Addalynn Kumari ?Today's Date: 12/19/2021 ?Reason for consult: L&D Initial assessment ?Age:25 hours ?Per mom, she really wants to breastfeed her 2nd child, she only BF 1st child for 2 weeks due latch difficulties and low milk supply. ?LC entered the room, infant was being weighed and cuing to breastfeed. ?Mom latched infant on her right breast using the football hold position, infant sustained latch and BF for 15 minutes. ?Mom will try to latch infant on both breast during a feeding. ?Mom will BF infant according to primal cues, 8 to 12+ or more times within 24 hours, skin to skin. ?Mom knows to ask RN/LC for further latch assistance on MBU if needed. ?LC congratulated parents on the birth of their son Anette Riedel).  ?Maternal Data ?  ? ?Feeding ?Mother's Current Feeding Choice: Breast Milk ? ?LATCH Score ?Latch: Grasps breast easily, tongue down, lips flanged, rhythmical sucking. ? ?Audible Swallowing: A few with stimulation ? ?Type of Nipple: Everted at rest and after stimulation ? ?Comfort (Breast/Nipple): Soft / non-tender ? ?Hold (Positioning): Full assist, staff holds infant at breast ? ?LATCH Score: 7 ? ? ?Lactation Tools Discussed/Used ?  ? ?Interventions ?Interventions: Assisted with latch;Skin to skin;Breast compression;Adjust position;Support pillows;Position options;Education ? ?Discharge ?  ? ?Consult Status ?Consult Status: Follow-up from L&D ? ? ? ?Danelle Earthly ?12/19/2021, 6:25 PM ? ? ? ?

## 2021-12-19 NOTE — H&P (Signed)
[name] is a 26 y.o. female G2P1001 at 31 1/7 weeks (EDD 12/25/21 by 12 week [place] and unknown LMP) presenting for elective IOL at term. ?Prenatal care significnat for: ?1) HSV-- ?ppx at 36wks ?2) Anxiety disorder  ?on meds in past, nothing in one year. Reported at 27 weeks feeling worse and anxiety not good--referred for mental health evaluation and awaiting pairing with in-person psych due to self-reported intolerance to many psych meds ?3) Syncope  ?Pt seen by neuro for episodes of blacking out, they feel syncope   MRI and EEG WNL ?PCP did EKG ? ?OB History   ? ? Gravida  ?2  ? Para  ?1  ? Term  ?1  ? Preterm  ?0  ? AB  ?0  ? Living  ?1  ?  ? ? SAB  ?0  ? IAB  ?0  ? Ectopic  ?0  ? Multiple  ?0  ? Live Births  ?1  ?   ?  ?  ?03-14-2017, 39.3 wks ?M, 7lbs 1oz, NSVD ? ?Past Medical History:  ?Diagnosis Date  ? Abdominal pain   ? Allergy   ? Anxiety   ? Constipation   ? Depression   ? Diarrhea   ? Headache(784.0)   ? HSV (herpes simplex virus) anogenital infection   ? Proctalgia   ? Syncope   ? Yeast infection   ? ?Past Surgical History:  ?Procedure Laterality Date  ? APPENDECTOMY  08/18/2011  ? birth control implant in arm    ? Hx: of  ? ESOPHAGOGASTRODUODENOSCOPY N/A 02/19/2013  ? Procedure: ESOPHAGOGASTRODUODENOSCOPY (EGD);  Surgeon: Tamara Blend, MD;  Location: [place];  Service: Gastroenterology;  Laterality: N/A;  ? LAPAROSCOPIC APPENDECTOMY  08/18/2011  ? Procedure: APPENDECTOMY LAPAROSCOPIC;  Surgeon: Tamara Mages. Gerald Stabs, MD;  Location: [facility];  Service: Pediatrics;;  ? ?Family History: family history includes Arthritis in her maternal grandmother; Bipolar disorder in her brother; Cancer in her father and maternal grandfather; Diabetes in her maternal grandmother; Hearing loss in her maternal grandmother. ?Social History:  reports that she has never smoked. She has never used smokeless tobacco. She reports that she does not currently use alcohol. She reports that she does not currently use drugs after having  used the following drugs: Marijuana. ? ? ?  ?Maternal Diabetes: No ?Genetic Screening: Declined ?Maternal Ultrasounds/Referrals: Normal ?Fetal Ultrasounds or other Referrals:  None ?Maternal Substance Abuse:  No ?Significant Maternal Medications:  Meds include: Other:  ?Significant Maternal Lab Results:  Group B Strep positive ?Other Comments:  None ? ?Review of Systems  ?Constitutional:  Negative for fever.  ?Genitourinary:  Negative for pelvic pain and vaginal bleeding.  ?Maternal Medical History:  ?Contractions: Frequency: irregular.   ?Perceived severity is mild.   ?Fetal activity: Perceived fetal activity is normal.   ?Prenatal complications: HSV, anxiety, palpitations ?Prenatal Complications - Diabetes: none. ? ?FHR category 1 ?Dilation: 3 ?Effacement (%): 60 ?Station: -3 ?Exam by:: Tamara Bake, RN ?Blood pressure 119/62, pulse 79, temperature 98.1 ?F (36.7 ?C), temperature source Oral, resp. rate 18, height 5\' 5"  (1.651 m), weight 104.1 kg, unknown if currently breastfeeding. ?Maternal Exam:  ?Uterine Assessment: Contraction strength is mild.  Contraction frequency is irregular.  ?Abdomen: Patient reports no abdominal tenderness. Fetal presentation: vertex ?Introitus: Normal vulva.  ?Physical Exam ?Cardiovascular:  ?   Rate and Rhythm: Normal rate and regular rhythm.  ?Pulmonary:  ?   Effort: Pulmonary effort is normal.  ?Abdominal:  ?   Palpations: Abdomen is soft.  ?  Genitourinary: ?   General: Normal vulva.  ?Neurological:  ?   Mental Status: She is alert.  ?Psychiatric:     ?   Mood and Affect: Mood normal.  ?  ?Prenatal labs: ?ABO, Rh: --/--/B POS (03/08 [id number]) ?Antibody: NEG (03/08 0955) ?Rubella: Nonimmune (09/01 0000) ?RPR: Nonreactive (09/01 0000)  ?HBsAg: Negative (09/01 0000)  ?HIV: Non-reactive (09/01 0000)  ?GBS: Positive/-- (02/14 0000)  ?One hour GCT 97 ? ?Assessment/Plan: ?Pt for IOL at term.    ?Plan pitocin and AROM after GBS prophylaxis on board ?[name] ?12/19/2021, 12:14  PM ? ? ? ? ?

## 2021-12-19 NOTE — Anesthesia Preprocedure Evaluation (Signed)
Anesthesia Evaluation  ?Patient identified by MRN, date of birth, ID band ?Patient awake ? ? ? ?Reviewed: ?Allergy & Precautions, H&P , Patient's Chart, lab work & pertinent test results ? ?Airway ?Mallampati: II ? ?TM Distance: >3 FB ?Neck ROM: full ? ? ? Dental ?no notable dental hx. ? ?  ?Pulmonary ?Current Smoker,  ?  ?breath sounds clear to auscultation ? ? ? ? ? ? Cardiovascular ?negative cardio ROS ?Normal cardiovascular exam ?Rhythm:regular Rate:Normal ? ? ?  ?Neuro/Psych ? Headaches, PSYCHIATRIC DISORDERS Anxiety Depression   ? GI/Hepatic ?  ?Endo/Other  ? ? Renal/GU ?  ? ?  ?Musculoskeletal ? ? Abdominal ?Normal abdominal exam  (+)   ?Peds ? Hematology ? ?(+) Blood dyscrasia, anemia ,   ?Anesthesia Other Findings ? ? Reproductive/Obstetrics ?(+) Pregnancy ? ?  ? ? ? ? ? ? ? ? ? ? ? ? ? ?  ?  ? ? ? ? ? ? ? ? ?Anesthesia Physical ?Anesthesia Plan ? ?ASA: 2 ? ?Anesthesia Plan: Epidural  ? ?Post-op Pain Management: Minimal or no pain anticipated  ? ?Induction:  ? ?PONV Risk Score and Plan: 2 and Treatment may vary due to age or medical condition ? ?Airway Management Planned: Natural Airway ? ?Additional Equipment: None ? ?Intra-op Plan:  ? ?Post-operative Plan:  ? ?Informed Consent: I have reviewed the patients History and Physical, chart, labs and discussed the procedure including the risks, benefits and alternatives for the proposed anesthesia with the patient or authorized representative who has indicated his/her understanding and acceptance.  ? ? ? ? ? ?Plan Discussed with: Anesthesiologist ? ?Anesthesia Plan Comments:   ? ? ? ? ? ? ?Anesthesia Quick Evaluation ? ?

## 2021-12-19 NOTE — Anesthesia Procedure Notes (Signed)
Epidural ?Patient location during procedure: OB ?Start time: 12/19/2021 2:45 PM ?End time: 12/19/2021 2:57 PM ? ?Staffing ?Anesthesiologist: Merlinda Frederick, MD ?Performed: anesthesiologist  ? ?Preanesthetic Checklist ?Completed: patient identified, IV checked, site marked, risks and benefits discussed, monitors and equipment checked, pre-op evaluation and timeout performed ? ?Epidural ?Patient position: sitting ?Prep: DuraPrep ?Patient monitoring: heart rate, cardiac monitor, continuous pulse ox and blood pressure ?Approach: midline ?Location: L2-L3 ?Injection technique: LOR saline ? ?Needle:  ?Needle type: Tuohy  ?Needle gauge: 17 G ?Needle length: 9 cm ?Needle insertion depth: 7 cm ?Catheter type: closed end flexible ?Catheter size: 20 Guage ?Catheter at skin depth: 12 cm ?Test dose: negative and Other ? ?Assessment ?Events: blood not aspirated, injection not painful, no injection resistance and negative IV test ? ?Additional Notes ?Informed consent obtained prior to proceeding including risk of failure, 1% risk of PDPH, risk of minor discomfort and bruising.  Discussed rare but serious complications including epidural abscess, permanent nerve injury, epidural hematoma.  Discussed alternatives to epidural analgesia and patient desires to proceed.  Timeout performed pre-procedure verifying patient name, procedure, and platelet count.  Patient tolerated procedure well. ? ? ? ? ?

## 2021-12-19 NOTE — Progress Notes (Signed)
Patient ID: [name], female   DOB: 11-Nov-1995, 26 y.o.   MRN: [id number] ?Pt got uncomfortable and received epidural ? ?Afeb VSS ?FHR with good variability and some mild variable decels with contractions ? ?90/7/-1 ? ?Good progress, will place in [place] position ? ? ?

## 2021-12-20 LAB — CBC
HCT: 31.8 % — ABNORMAL LOW (ref 36.0–46.0)
Hemoglobin: 10.6 g/dL — ABNORMAL LOW (ref 12.0–15.0)
MCH: 28 pg (ref 26.0–34.0)
MCHC: 33.3 g/dL (ref 30.0–36.0)
MCV: 84.1 fL (ref 80.0–100.0)
Platelets: 210 10*3/uL (ref 150–400)
RBC: 3.78 MIL/uL — ABNORMAL LOW (ref 3.87–5.11)
RDW: 13.4 % (ref 11.5–15.5)
WBC: 13 10*3/uL — ABNORMAL HIGH (ref 4.0–10.5)
nRBC: 0 % (ref 0.0–0.2)

## 2021-12-20 MED ORDER — MEASLES, MUMPS & RUBELLA VAC IJ SOLR: 0.5000 mL | Freq: Once | INTRAMUSCULAR | Status: DC

## 2021-12-20 NOTE — Lactation Note (Addendum)
This note was copied from a baby's chart. ?Lactation Consultation Note ? ?Patient Name: [name] ?Today's Date: 12/20/2021 ?Reason for consult: Initial assessment;Term ?Age:25 hours ? ?Mom feels baby is falling asleep at the breast.  She has soreness on the right nipple. ?Mom made the comment to dad that baby has gained wt. Since yesterday.  LC will contact RN to notify about weight discrepancy. ? ?Baby was placed STS. Cued to latch.  Unable to grasp breast and would not begin sucking.   ?Mom is able to hand express.  2 ml collected and finger fed back to baby encouraging sucking.  Sucking initiated with gloved finger.  Top lip tucks, unable to flange. ?Baby was placed in football position and [facility] assisted with latching.  Rhythmic sucking noted and swallows heard.  [facility] taught mom and dad to listen for swallows.  Reviewed importance of baby being deep onto the breast, feeding STS, and hand expressing.  BF basics reviewed.   ? ? ?Mom needed a great deal of assistance with positioning and latching.  Encouraged mom to call out for assistance throughout the day when needed.  ?Resources reviewed and mom was encouraged to seek OP LC appt.Mom goes to [employer].   Because she desires to Exc. BF this child. ? ? ?Maternal Data ?Has patient been taught Hand Expression?: Yes ?Does the patient have breastfeeding experience prior to this delivery?: Yes ?How long did the patient breastfeed?: 2 wks BF, low milk supply, trouble latching. ? ?Feeding ?Mother's Current Feeding Choice: Breast Milk ? ?LATCH Score ?Latch: Repeated attempts needed to sustain latch, nipple held in mouth throughout feeding, stimulation needed to elicit sucking reflex. ? ?Audible Swallowing: A few with stimulation ? ?Type of Nipple: Everted at rest and after stimulation ? ?Comfort (Breast/Nipple): Filling, red/small blisters or bruises, mild/mod discomfort (mild discomfort on the right nipple from previous feeding) ? ?Hold (Positioning): Assistance  needed to correctly position infant at breast and maintain latch. ? ?LATCH Score: 6 ? ? ?Lactation Tools Discussed/Used ?  ? ?Interventions ?Interventions: Breast feeding basics reviewed;Assisted with latch;Skin to skin;Hand express;Adjust position;Support pillows ? ?Discharge ?  ? ?Consult Status ?Consult Status: Follow-up ?Date: 12/21/21 ?Follow-up type: In-patient ? ? ? ?Ferne Coe Farrelly ?12/20/2021, 8:40 AM ? ? ? ?

## 2021-12-20 NOTE — Social Work (Signed)
CSW received consult due to score 11 on Edinburgh Depression Screen.   ? ?CSW met with [name] today. CSW provided education regarding Baby Blues vs PMADs and provided [name] with resources for mental health follow up.  CSW encouraged [name] to evaluate her mental health throughout the postpartum period with the use of the New Mom Checklist developed by Postpartum Progress as well as the [place] Postnatal Depression Scale and notify a medical professional if symptoms arise.    ? ?Kathrin Greathouse, MSW, LCSW ?[facility] [place]  ?Clinical Social Worker  ?[phone] ?12/20/2021  11:42 AM  ?

## 2021-12-20 NOTE — Progress Notes (Signed)
Patient is eating, ambulating, voiding.  Pain control is good. ? ?Vitals:  ? 12/19/21 1900 12/19/21 2004 12/20/21 0009 12/20/21 0451  ?BP: 123/73 132/79 120/74 113/62  ?Pulse: (!) 102 95 (!) 104 87  ?Resp: $Remov'16 18 16 16  'NfRaVL$ ?Temp: 98.3 ?F (36.8 ?C) 99 ?F (37.2 ?C) 98 ?F (36.7 ?C) 98.1 ?F (36.7 ?C)  ?TempSrc: Axillary Oral Oral Oral  ?SpO2: 100% 99% 99% 97%  ?Weight:      ?Height:      ? ? ?Fundus firm ?Perineum without swelling. ? ?Lab Results  ?Component Value Date  ? WBC 13.0 (H) 12/20/2021  ? HGB 10.6 (L) 12/20/2021  ? HCT 31.8 (L) 12/20/2021  ? MCV 84.1 12/20/2021  ? PLT 210 12/20/2021  ? ? ?--/--/B POS (03/08 0955)/RNONIMM ? ?A/P Post partum day 1. ? Routine care.  Expect d/c routine.  MMR. ? ?[name] ?  ?

## 2021-12-20 NOTE — Social Work (Signed)
CSW received consult for hx of Anxiety. CSW met with [name] to offer support and complete assessment.   ? ?CSW met with [name] at bedside and introduced CSW role. CSW observed [name] sitting up in the bed with the infant lying next to her positioned on a Boppy pillow. FOB was asleep at bedside. [name] presented pleasant and welcomed CSW visit. [name] gave CSW permission to complete the assessment with FOB present.  CSW inquired how [name] has felt since giving birth. [name] reported ?feeling good? and that the delivery went ?very fast.? CSW inquired how [name] felt emotionally during the pregnancy. [name] reported "it was emotionally rough." [name] shared she felt mad often and felt they were because of the pregnancy hormones. [name] reported she was diagnosed with anxiety at age [age] and treated her symptoms with psychotropic medication about two years ago which she felt was ?kind of helpful.? [name] reported she saw a therapist at age 19 and is currently interested in seeing a therapist again. [name] reported her provider gave her resources for [place], a counseling agency however she stated that she never received a follow up. CSW provided [name] with a list of therapy resources and encouraged [name] to reach out and make an appointment. [name] reported she will follow up. CSW inquired about [name] coping strategies. [name] explained that she was unsure of her coping strategies. CSW led [name] in recognizing her coping strategies such as her self-care routine and encouraged her to implement additional coping strategies. [name] reported understanding. CSW discussed PPD. [name] reported she experienced PPD about five years ago after the birth of her older child as evidenced by feeling sad and lonely. CSW provided education regarding the baby blues period vs. perinatal mood disorders, discussed treatment. CSW recommended [name] complete a self-evaluation during the postpartum time period using the New Mom Checklist from Postpartum Progress and encouraged [name] to contact a  medical professional if symptoms are noted at any time. [name] reported she feels comfortable reaching out to her provider if she has concerns. CSW inquired about [name] supports. [name] acknowledged her best friend, mom and FOB as supports. CSW assessed [name] for safety. [name] denied thoughts of harm to self and others.  ? ?[name] reported she has essential items for the infant including a bassinet where the infant will sleep. CSW provided review of Sudden Infant Death Syndrome (SIDS) precautions. [name] to decide on a pediatrician for infant's follow up care. [name] inquired about the WIC process and requested help. CSW assisted [name] in scheduling a WIC appointment. CSW assessed [name] for additional needs. [name] reported no further need.  ? ?CSW identifies no further need for intervention and no barriers to discharge at this time.  ? ?[name], MSW, LCSW ?[facility]  ?Clinical Social Worker  ?[phone] ?12/20/2021  10:44 AM  ?

## 2021-12-20 NOTE — Anesthesia Postprocedure Evaluation (Signed)
Anesthesia Post Note ? ?Patient: [name] ? ?Procedure(s) Performed: AN AD HOC LABOR EPIDURAL ? ?  ? ?Patient location during evaluation: Mother Baby ?Anesthesia Type: Epidural ?Level of consciousness: awake, oriented and awake and alert ?Pain management: pain level controlled ?Vital Signs Assessment: post-procedure vital signs reviewed and stable ?Respiratory status: spontaneous breathing, respiratory function stable and nonlabored ventilation ?Cardiovascular status: stable ?Postop Assessment: adequate PO intake, able to ambulate, patient able to bend at knees and no apparent nausea or vomiting ?Anesthetic complications: no ? ? ?No notable events documented. ? ?Last Vitals:  ?Vitals:  ? 12/20/21 0009 12/20/21 0451  ?BP: 120/74 113/62  ?Pulse: (!) 104 87  ?Resp: 16 16  ?Temp: 36.7 ?C 36.7 ?C  ?SpO2: 99% 97%  ?  ?Last Pain:  ?Vitals:  ? 12/20/21 0451  ?TempSrc: Oral  ?PainSc: 4   ? ?Pain Goal:   ? ?  ?  ?  ?  ?  ?  ?  ? ?Hawk Mones ? ? ? ? ?

## 2021-12-21 MED ORDER — IBUPROFEN 800 MG PO TABS: 800.0000 mg | ORAL_TABLET | Freq: Three times a day (TID) | ORAL | 1 refills | Status: DC | PRN

## 2021-12-21 NOTE — Lactation Note (Signed)
This note was copied from a baby's chart. ?Lactation Consultation Note ? ?Patient Name: [name] ?Today's Date: 12/21/2021 ?Reason for consult: Follow-up assessment ?Age:25 hours ? ? ?P2 mother whose infant is now 36 hours old.  This is a term baby at 39+1 weeks.  Mother's current feeding preference is breast. ? ?Baby has been discharged and family packing.  Baby was asleep on mother's chest when I arrived.  Mother was happy to report that she has recently fed for 20 minutes and baby was content.  He is voiding/stooling well.  Mother had no further questions/concerns at this time.  She is familiar with engorgement and did not wish to review.  Father present.  Mother has our OP phone number for questions after discharge. ? ? ?Maternal Data ?  ? ?Feeding ?  ? ?LATCH Score ?  ? ?  ? ?  ? ?  ? ?  ? ?  ? ? ?Lactation Tools Discussed/Used ?  ? ?Interventions ?  ? ?Discharge ?Discharge Education: Engorgement and breast care ? ?Consult Status ?Consult Status: Complete ?Date: 12/21/21 ?Follow-up type: Call as needed ? ? ? ?Kirke Breach R Chasmine Lender ?12/21/2021, 12:50 PM ? ? ? ?

## 2021-12-21 NOTE — Discharge Summary (Signed)
? ?  Postpartum Discharge Summary ? ?Date of Service updated ? ?   ?Patient Name: [name] ?DOB: 1996-03-01 ?MRN: [id number] ? ?Date of admission: 12/19/2021 ?Delivery date:12/19/2021  ?Delivering provider: Huel Cote  ?Date of discharge: 12/21/2021 ? ?Admitting diagnosis: Encounter for elective induction of labor [Z34.90] ?NSVD (normal spontaneous vaginal delivery) [O80] ?Intrauterine pregnancy: 103w1d     ?Secondary diagnosis:  Principal Problem: ?  Encounter for elective induction of labor ?Active Problems: ?  NSVD (normal spontaneous vaginal delivery) ? ?Additional problems: n/a    ?Discharge diagnosis: Term Pregnancy Delivered                                              ?Post partum procedures:none ?Augmentation: AROM and Pitocin ?Complications: None ? ?Hospital course: Induction of Labor With Vaginal Delivery   ?26 y.o. yo G2P2002 at [redacted]w[redacted]d was admitted to the hospital 12/19/2021 for induction of labor.  Indication for induction: Favorable cervix at term.  Patient had an uncomplicated labor course as follows: ?Membrane Rupture Time/Date: 12:35 PM ,12/19/2021   ?Delivery Method:Vaginal, Spontaneous  ?Episiotomy: None  ?Lacerations:  1st degree;Perineal  ?Details of delivery can be found in separate delivery note.  Patient had a routine postpartum course. Patient is discharged home 12/21/21. ? ?Newborn Data: ?Birth date:12/19/2021  ?Birth time:4:55 PM  ?Gender:Female  ?Living status:Living  ?Apgars:9 ,9  ?Weight:3540 g  ? ?Physical exam  ?Vitals:  ? 12/20/21 1000 12/20/21 1615 12/20/21 2100 12/21/21 0510  ?BP: 119/68 116/62 136/60 118/72  ?Pulse: 89 97 81 88  ?Resp: 17 18 17 17   ?Temp: 98 ?F (36.7 ?C) 98 ?F (36.7 ?C) 98.2 ?F (36.8 ?C) 98 ?F (36.7 ?C)  ?TempSrc: Oral Oral Oral Oral  ?SpO2: 99% 100% 100% 99%  ?Weight:      ?Height:      ? ?General: alert, cooperative, and no distress ?Lochia: appropriate ?Uterine Fundus: firm ?Incision: N/A ?DVT Evaluation: No evidence of DVT seen on physical exam. ?Negative Homan's  sign. ?No cords or calf tenderness. ?Labs: ?Lab Results  ?Component Value Date  ? WBC 13.0 (H) 12/20/2021  ? HGB 10.6 (L) 12/20/2021  ? HCT 31.8 (L) 12/20/2021  ? MCV 84.1 12/20/2021  ? PLT 210 12/20/2021  ? ?CMP Latest Ref Rng & Units 10/03/2021  ?Glucose 70 - 99 mg/dL 10/05/2021)  ?BUN 6 - 20 mg/dL 7  ?Creatinine 0.57 - 1.00 mg/dL 086(V)  ?Sodium 134 - 144 mmol/L 138  ?Potassium 3.5 - 5.2 mmol/L 4.4  ?Chloride 96 - 106 mmol/L 103  ?CO2 20 - 29 mmol/L 18(L)  ?Calcium 8.7 - 10.2 mg/dL 8.7  ?Total Protein 6.0 - 8.5 g/dL 7.84(O)  ?Total Bilirubin 0.0 - 1.2 mg/dL 9.6(E  ?Alkaline Phos 44 - 121 IU/L 126(H)  ?AST 0 - 40 IU/L 11  ?ALT 0 - 32 IU/L 12  ? ?Edinburgh Score: ?Edinburgh Postnatal Depression Scale Screening Tool 12/20/2021  ?I have been able to laugh and see the funny side of things. 0  ?I have looked forward with enjoyment to things. 0  ?I have blamed myself unnecessarily when things went wrong. 2  ?I have been anxious or worried for no good reason. 3  ?I have felt scared or panicky for no good reason. 2  ?Things have been getting on top of me. 1  ?I have been so unhappy that I have had difficulty sleeping. 1  ?  I have felt sad or miserable. 1  ?I have been so unhappy that I have been crying. 1  ?The thought of harming myself has occurred to me. 0  ?Edinburgh Postnatal Depression Scale Total 11  ? ? ? ? ?After visit meds:  ?Allergies as of 12/21/2021   ? ?   Reactions  ? Amoxicillin Hives  ? Penicillins Hives  ? Has patient had a PCN reaction causing immediate rash, facial/tongue/throat swelling, SOB or lightheadedness with hypotension: Yes ?Has patient had a PCN reaction causing severe rash involving mucus membranes or skin necrosis: Yes ?Has patient had a PCN reaction that required hospitalization unknown ?Has patient had a PCN reaction occurring within the last 10 years: unknown ?If all of the above answers are "NO", then may proceed with Cephalosporin use.  ? Phenergan [promethazine] Nausea And Vomiting, Other (See  Comments)  ? Doesn't work for patient  ? Penicillins   ? Hives, vomiting  ? ?  ? ?  ?Medication List  ?  ? ?TAKE these medications   ? ?ibuprofen 800 MG tablet ?Commonly known as: ADVIL ?Take 1 tablet (800 mg total) by mouth every 8 (eight) hours as needed. ?  ?prenatal multivitamin Tabs tablet ?Take 1 tablet by mouth daily at 12 noon. ?  ? ?  ? ? ? ?Discharge home in stable condition ?Infant Feeding: Breast ?Infant Disposition:home with mother ?Discharge instruction: per After Visit Summary and Postpartum booklet. ?Activity: Advance as tolerated. Pelvic rest for 6 weeks.  ?Diet: routine diet ?Anticipated Birth Control: Unsure ?Postpartum Appointment:6 weeks ?Follow up Visit:[facility]  ? ?12/21/2021 ?Carlisle Cater, MD ? ? ?

## 2021-12-21 NOTE — Progress Notes (Addendum)
Patient is eating, ambulating, voiding.  Pain control is good. Mood stable ? ?Vitals:  ? 12/20/21 1000 12/20/21 1615 12/20/21 2100 12/21/21 0510  ?BP: 119/68 116/62 136/60 118/72  ?Pulse: 89 97 81 88  ?Resp: _0 ?Temp: 98 ?F (36.7 ?C) 98 ?F (36.7 ?C) 98.2 ?F (36.8 ?C) 98 ?F (36.7 ?C)  ?TempSrc: Oral Oral Oral Oral  ?SpO2: 99% 100% 100% 99%  ?Weight:      ?Height:      ? ? ?Fundus firm ?Perineum without swelling. ? ?Lab Results  ?Component Value Date  ? WBC 13.0 (H) 12/20/2021  ? HGB 10.6 (L) 12/20/2021  ? HCT 31.8 (L) 12/20/2021  ? MCV 84.1 12/20/2021  ? PLT 210 12/20/2021  ? ? ?--/--/B POS (03/08 0955)/ ? ?A/P Post partum day 2. ? Routine care.   ?NO CIRC for baby boy, breastfeeding ?DC home today ?Mood stable, plan for 6wk f/u ?Declined MMR ? ?[place] ?  ?

## 2022-05-10 IMAGING — US US OB < 14 WEEKS - US OB TV
1 series · 13 of 28 positions shown · non-contrast
Comparison: None

CLINICAL DATA: Bleeding in first trimester pregnancy, LMP
03/16/2021, spotting yesterday, quantitative beta HCG = 900

EXAM:
OBSTETRIC <14 WK US AND TRANSVAGINAL OB US
TECHNIQUE: Both transabdominal and transvaginal ultrasound examinations were
performed for complete evaluation of the gestation as well as the
maternal uterus, adnexal regions, and pelvic cul-de-sac.
Transvaginal technique was performed to assess early pregnancy.

[Series 1: us ob < 14 weeks - us ob tv · 66 acquisitions, 13 frames shown]
[im 3/66]
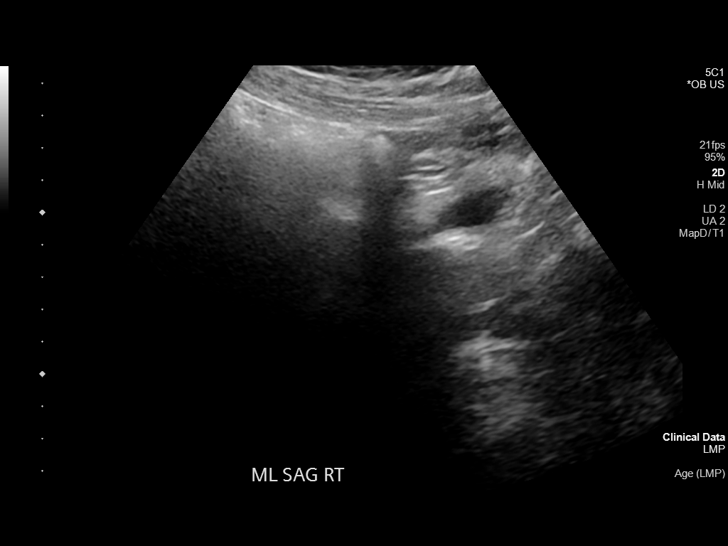
[im 8/66]
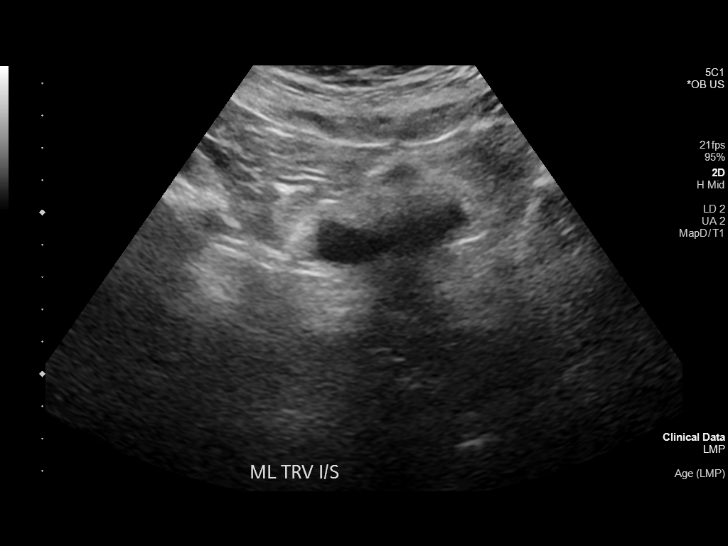
[im 13/66]
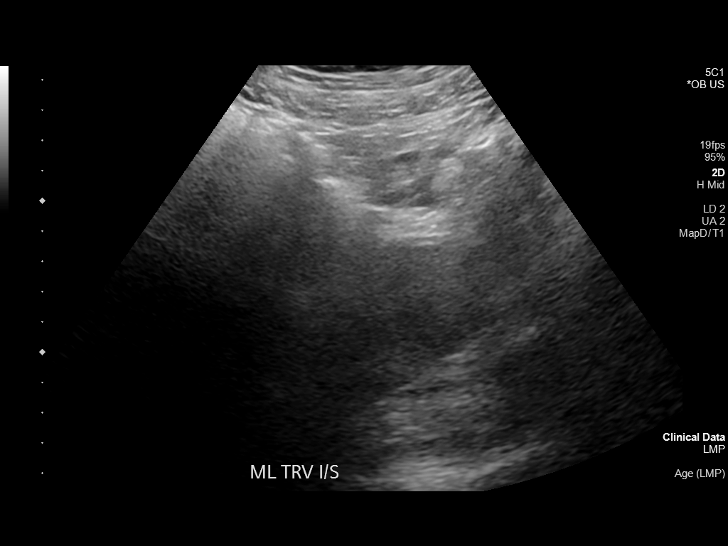
[im 17/66]
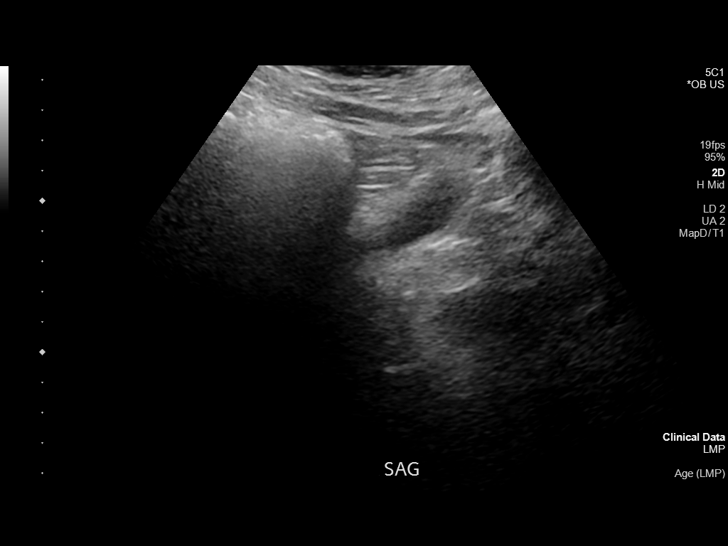
[im 22/66]
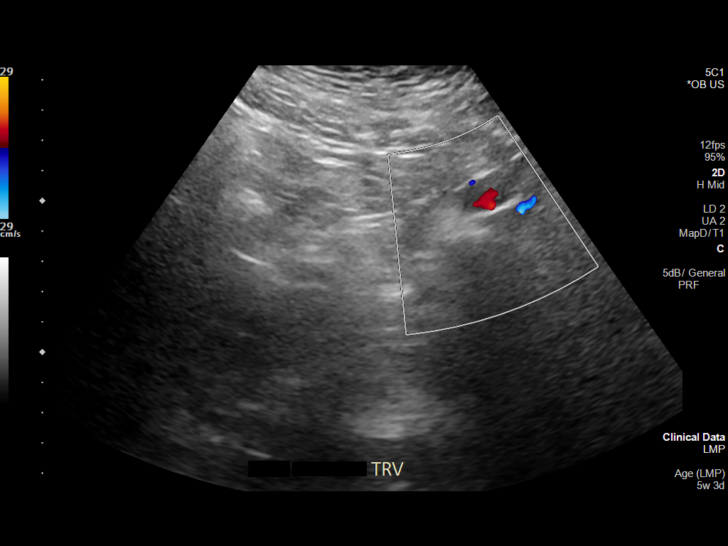
[im 27/66]
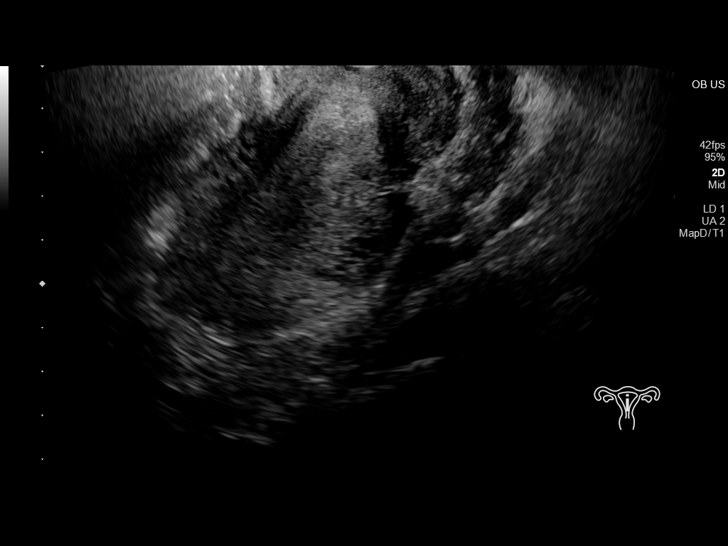
[im 34/66]
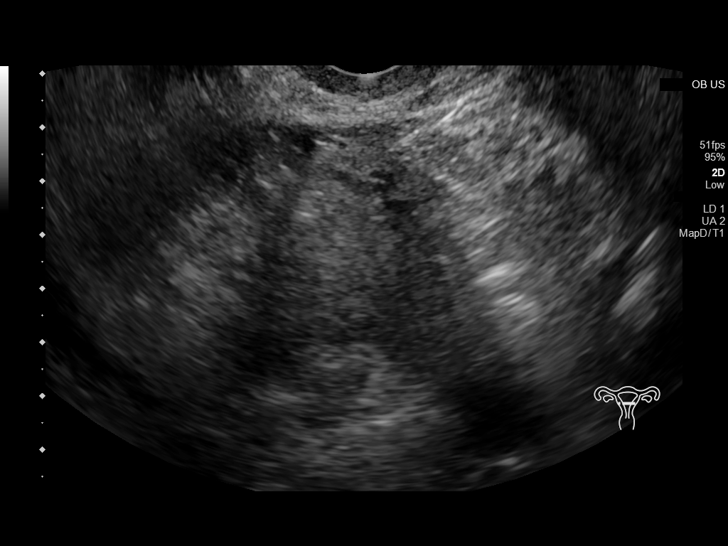
[im 39/66]
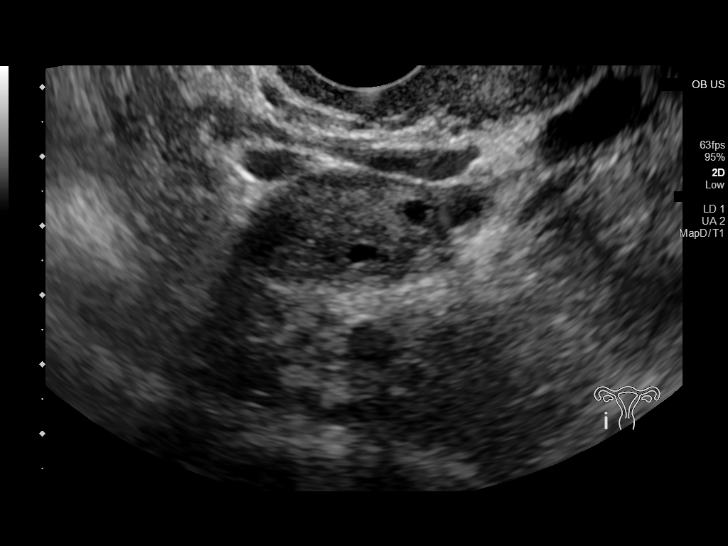
[im 44/66]
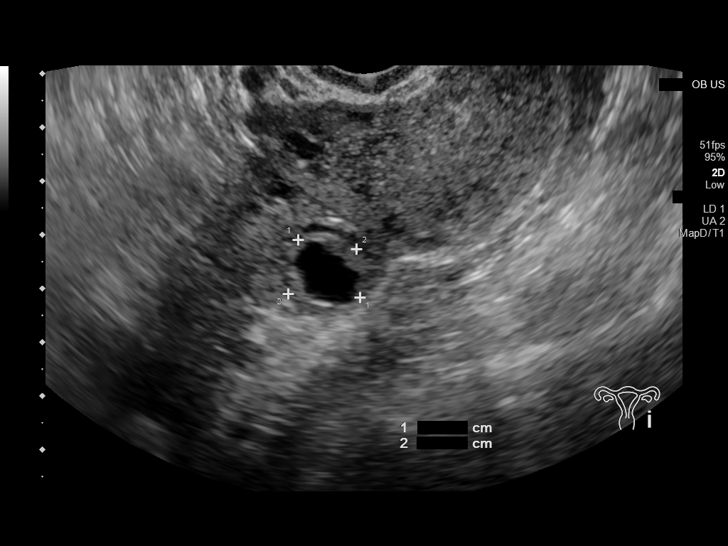
[im 49/66]
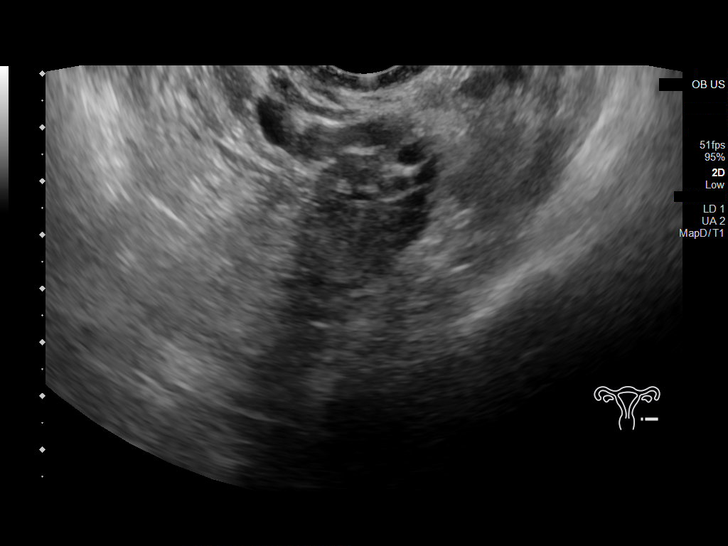
[im 53/66]
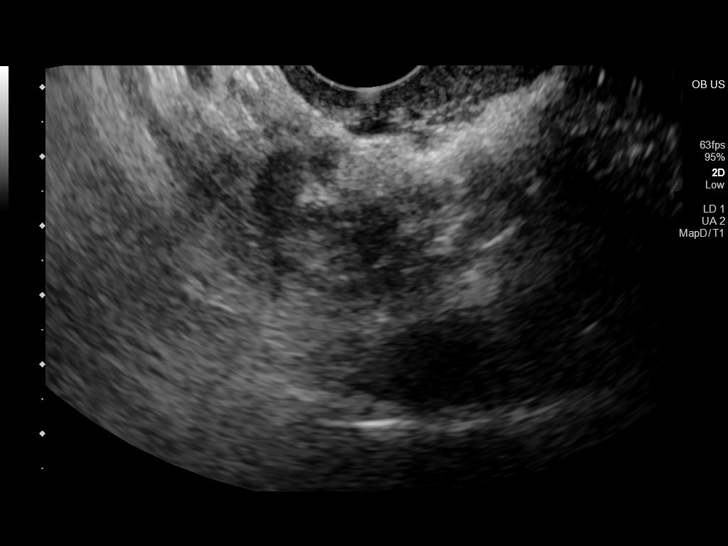
[im 58/66]
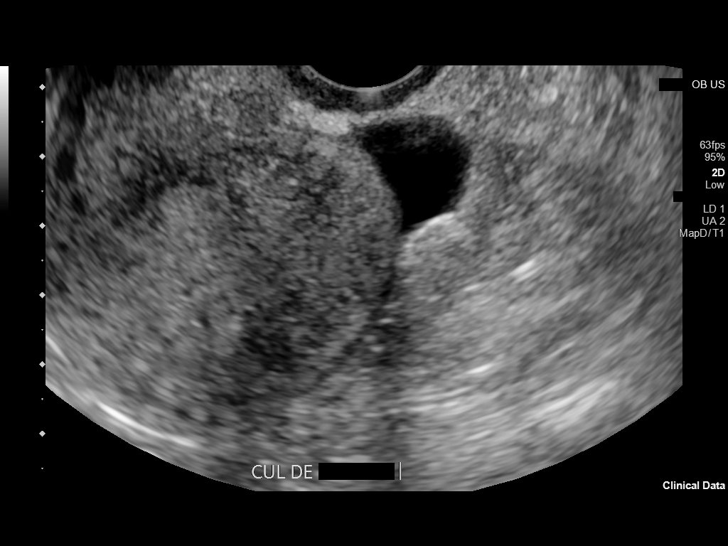
[im 63/66]
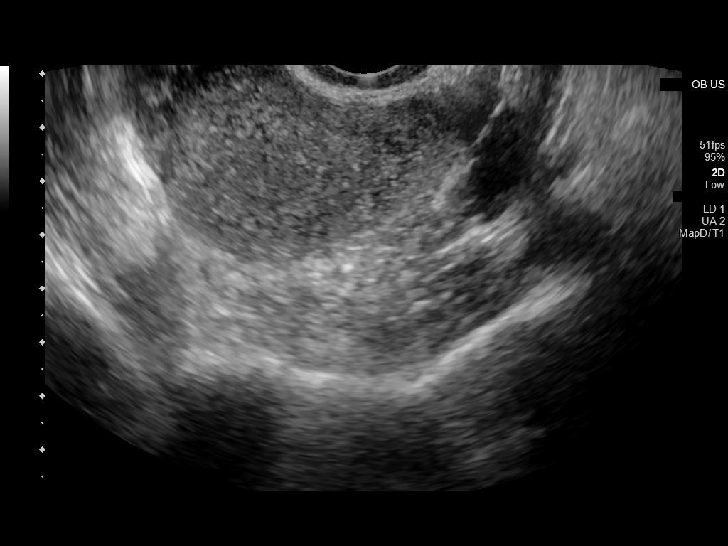

[13 of 28 positions shown; findings below may reference images not displayed]

FINDINGS: Intrauterine gestational sac: Absent

Yolk sac:  N/A

Embryo:  N/A

Cardiac Activity: N/A

Heart Rate: N/A  bpm

MSD:   mm    w     d

CRL:    mm    w    d                  US EDC:

Subchorionic hemorrhage:  N/A

Maternal uterus/adnexae:

Uterus anteverted, normal appearance.

No uterine mass, endometrial fluid, or gestational sac identified.

Small amount of free pelvic fluid in cul-de-sac, nonspecific.

Corpus luteum within LEFT ovary, otherwise normal appearing ovaries.

No adnexal masses.
IMPRESSION: No intrauterine gestation identified.

Findings are consistent with pregnancy of unknown location.

Differential diagnosis includes early intrauterine pregnancy too
early to visualize, spontaneous abortion, and ectopic pregnancy.

Serial quantitative beta HCG and or follow-up ultrasound recommended
to definitively exclude ectopic pregnancy.

## 2023-01-01 ENCOUNTER — Encounter (HOSPITAL_COMMUNITY): Payer: Self-pay | Admitting: Obstetrics and Gynecology

## 2023-01-02 ENCOUNTER — Encounter: Payer: Medicaid Other | Admitting: Nurse Practitioner

## 2023-01-13 ENCOUNTER — Encounter: Payer: Self-pay | Admitting: Nurse Practitioner

## 2023-01-13 ENCOUNTER — Ambulatory Visit (INDEPENDENT_AMBULATORY_CARE_PROVIDER_SITE_OTHER): Payer: Medicaid Other | Admitting: Nurse Practitioner

## 2023-01-13 VITALS — BP 124/77 | HR 91 | Temp 98.0°F | Ht 65.0 in | Wt 207.0 lb

## 2023-01-13 DIAGNOSIS — Z6834 Body mass index (BMI) 34.0-34.9, adult: Secondary | ICD-10-CM | POA: Diagnosis not present

## 2023-01-13 DIAGNOSIS — E669 Obesity, unspecified: Secondary | ICD-10-CM

## 2023-01-13 DIAGNOSIS — G43711 Chronic migraine without aura, intractable, with status migrainosus: Secondary | ICD-10-CM

## 2023-01-13 NOTE — Progress Notes (Signed)
Office: [phone]  /  Fax: [phone]   Initial Visit  [name] was seen in clinic today to evaluate for obesity. She is interested in losing weight to improve overall health and reduce the risk of weight related complications. She presents today to review program treatment options, initial physical assessment, and evaluation.     She was referred by: Specialist  When asked what else they would like to accomplish? She states: Improve energy levels and physical activity and Lose a target amount of weight : Goal weight 150 lbs.    Weight history:  She started gaining weight in middle school and has struggled with her weight since then.  She craves sugar and struggles with overeating.    When asked how has your weight affected you? She states: Has affected self-esteem, Relationships, Having fatigue, Having poor endurance, and Problems with depression and or anxiety  Some associated conditions: migraines  Contributing factors: Family history, Medications, Stress, and Pregnancy  Weight promoting medications identified: Contraceptives or hormonal therapy  Current nutrition plan: None  Current level of physical activity: None  Current or previous pharmacotherapy: Phentermine  Response to medication: She was recently prescribed Phentermine.  She took in the past.  She stopped due to side effects of headaches.     Past medical history includes:   Past Medical History:  Diagnosis Date   Abdominal pain    Allergy    Anxiety    Constipation    Depression    Diarrhea    Headache(784.0)    HSV (herpes simplex virus) anogenital infection    Proctalgia    Syncope    Yeast infection      Objective:   BP 124/77   Pulse 91   Temp 98 F (36.7 C)   Ht 5\' 5"  (1.651 m)   Wt 207 lb (93.9 kg)   SpO2 100%   BMI 34.45 kg/m  She was weighed on the bioimpedance scale: Body mass index is 34.45 kg/m.  Peak Weight:225 , Body Fat%:37.9, Visceral Fat Rating:7, Weight trend over  the last 12 months: Increasing  General:  Alert, oriented and cooperative. Patient is in no acute distress.  Respiratory: Normal respiratory effort, no problems with respiration noted   Gait: able to ambulate independently  Mental Status: Normal mood and affect. Normal behavior. Normal judgment and thought content.   DIAGNOSTIC DATA REVIEWED:  BMET    Component Value Date/Time   NA 138 10/03/2021 1122   K 4.4 10/03/2021 1122   CL 103 10/03/2021 1122   CO2 18 (L) 10/03/2021 1122   GLUCOSE 101 (H) 10/03/2021 1122   GLUCOSE 108 (H) 04/25/2021 1033   BUN 7 10/03/2021 1122   CREATININE 0.50 (L) 10/03/2021 1122   CALCIUM 8.7 10/03/2021 1122   GFRNONAA >60 04/25/2021 1033   GFRAA 129 07/29/2019 1527   No results found for: "HGBA1C" No results found for: "INSULIN" CBC    Component Value Date/Time   WBC 13.0 (H) 12/20/2021 0508   RBC 3.78 (L) 12/20/2021 0508   HGB 10.6 (L) 12/20/2021 0508   HGB 12.4 10/03/2021 1122   HCT 31.8 (L) 12/20/2021 0508   HCT 35.9 10/03/2021 1122   PLT 210 12/20/2021 0508   PLT 230 10/03/2021 1122   MCV 84.1 12/20/2021 0508   MCV 87 10/03/2021 1122   MCH 28.0 12/20/2021 0508   MCHC 33.3 12/20/2021 0508   RDW 13.4 12/20/2021 0508   RDW 12.0 10/03/2021 1122   Iron/TIBC/Ferritin/ %Sat No results found for: "  IRON", "TIBC", "FERRITIN", "IRONPCTSAT" Lipid Panel  No results found for: "CHOL", "TRIG", "HDL", "CHOLHDL", "VLDL", "LDLCALC", "LDLDIRECT" Hepatic Function Panel     Component Value Date/Time   PROT 5.7 (L) 10/03/2021 1122   ALBUMIN 3.6 (L) 10/03/2021 1122   AST 11 10/03/2021 1122   ALT 12 10/03/2021 1122   ALKPHOS 126 (H) 10/03/2021 1122   BILITOT <0.2 10/03/2021 1122   BILIDIR 0.1 07/09/2012 1222   IBILI 0.3 07/09/2012 1222      Component Value Date/Time   TSH 1.420 10/03/2021 1122     Assessment and Plan:   Chronic migraine without aura, with intractable migraine, so stated, with status migrainosus Continue to follow up with  PCP.    Generalized obesity  BMI 34.0-34.9,adult   Patient herself pulled out her IUD recently due to bleeding.  Saw Tona Sensing FNP last on 12/09/22.  Needs to let GYN know that she removed her IUD.  Was prescribed Phentermine during that visit but patient doesn't want to take it.  Took in the past and stopped due to side effects. Phentermine did make her headaches worse.  She is interested in starting a weight loss medication and have labs obtained.  Will obtain labs at her next visit if she decides to start the program or I recommended seeing her PCP for follow up and labs.  She is not and does not want to start birth control due to side effects.  Discussed that she would need to use some type of birth control if she does decide to start a weight loss medication in the future due to possible birth defects.  Unfortunately medicaid does not cover GLP-1s.  She is not a candidate for Phentermine due to side effects or Qsymia because she doesn't want to use birth control and side effects to Phentermine.  She states she is not breast feeding at this time.          Obesity Treatment / Action Plan:  Patient will work on garnering support from family and friends to begin weight loss journey. Will work on eliminating or reducing the presence of highly palatable, calorie dense foods in the home. Will complete provided nutritional and psychosocial assessment questionnaire before the next appointment. Will be scheduled for indirect calorimetry to determine resting energy expenditure in a fasting state.  This will allow [place] to create a reduced calorie, high-protein meal plan to promote loss of fat mass while preserving muscle mass.  Obesity Education Performed Today:  She was weighed on the bioimpedance scale and results were discussed and documented in the synopsis.  We discussed obesity as a disease and the importance of a more detailed evaluation of all the factors contributing to the disease.  We  discussed the importance of long term lifestyle changes which include nutrition, exercise and behavioral modifications as well as the importance of customizing this to her specific health and social needs.  We discussed the benefits of reaching a healthier weight to alleviate the symptoms of existing conditions and reduce the risks of the biomechanical, metabolic and psychological effects of obesity.  [name] appears to be in the action stage of change and states they are ready to start intensive lifestyle modifications and behavioral modifications.  30 minutes was spent today on this visit including the above counseling, pre-visit chart review, and post-visit documentation.  Reviewed by clinician on day of visit: allergies, medications, problem list, medical history, surgical history, family history, social history, and previous encounter notes pertinent to obesity diagnosis.  Tamara Rud Kynadee Dam FNP-C

## 2023-08-07 ENCOUNTER — Other Ambulatory Visit: Payer: Self-pay

## 2023-08-07 ENCOUNTER — Emergency Department (HOSPITAL_COMMUNITY)
Admission: EM | Admit: 2023-08-07 | Discharge: 2023-08-08 | Discharge status: Against Medical Advice | Payer: Medicaid Other | Attending: Emergency Medicine | Admitting: Emergency Medicine

## 2023-08-07 ENCOUNTER — Encounter (HOSPITAL_COMMUNITY): Payer: Self-pay | Admitting: Emergency Medicine

## 2023-08-07 DIAGNOSIS — R519 Headache, unspecified: Secondary | ICD-10-CM | POA: Diagnosis present

## 2023-08-07 DIAGNOSIS — Z5321 Procedure and treatment not carried out due to patient leaving prior to being seen by health care provider: Secondary | ICD-10-CM | POA: Insufficient documentation

## 2023-08-07 DIAGNOSIS — Z20822 Contact with and (suspected) exposure to covid-19: Secondary | ICD-10-CM | POA: Diagnosis not present

## 2023-08-07 DIAGNOSIS — G43809 Other migraine, not intractable, without status migrainosus: Secondary | ICD-10-CM | POA: Diagnosis not present

## 2023-08-07 DIAGNOSIS — G43909 Migraine, unspecified, not intractable, without status migrainosus: Secondary | ICD-10-CM | POA: Insufficient documentation

## 2023-08-07 HISTORY — DX: Migraine, unspecified, not intractable, without status migrainosus: G43.909

## 2023-08-07 MED ORDER — ONDANSETRON 4 MG PO TBDP
4.0000 mg | ORAL_TABLET | Freq: Once | ORAL | Status: AC
  Administered 2023-08-07: 4 mg via ORAL
  Filled 2023-08-07: qty 1

## 2023-08-07 MED ORDER — KETOROLAC TROMETHAMINE 15 MG/ML IJ SOLN
15.0000 mg | Freq: Once | INTRAMUSCULAR | Status: AC
  Administered 2023-08-07: 15 mg via INTRAMUSCULAR
  Filled 2023-08-07: qty 1

## 2023-08-07 NOTE — ED Triage Notes (Signed)
Patient reports migraine headache today unrelieved by Rx. Imitrex , mild nausea and photophobia .

## 2023-08-07 NOTE — ED Provider Triage Note (Signed)
Emergency Medicine Provider Triage Evaluation Note  [name] , a 27 y.o. female  was evaluated in triage.  Pt complains of headache. Pt sts she has daily headache but today it's more intense.  Endorse light/sound sensitivity with nausea.  No fever, chills, body aches, focal numbness or focal weakness.  Currently on her menstruation.  Took her home meds without relief  Review of Systems  Positive: As above Negative: As above  Physical Exam  BP (!) 140/98 (BP Location: Right Arm)   Pulse 96   Temp 98.3 F (36.8 C)   Resp 20   SpO2 100%  Gen:   Awake, no distress   Resp:  Normal effort  MSK:   Moves extremities without difficulty  Other:    Medical Decision Making  Medically screening exam initiated at 9:38 PM.  Appropriate orders placed.  [name] was informed that the remainder of the evaluation will be completed by another provider, this initial triage assessment does not replace that evaluation, and the importance of remaining in the ED until their evaluation is complete.     Fayrene Helper, PA-C 08/07/23 2142

## 2023-08-08 ENCOUNTER — Emergency Department (HOSPITAL_COMMUNITY)
Admission: EM | Admit: 2023-08-08 | Discharge: 2023-08-08 | Disposition: A | Discharge status: Home / Self Care | Payer: Medicaid Other | Attending: Emergency Medicine | Admitting: Emergency Medicine

## 2023-08-08 ENCOUNTER — Encounter (HOSPITAL_COMMUNITY): Payer: Self-pay

## 2023-08-08 ENCOUNTER — Other Ambulatory Visit: Payer: Self-pay

## 2023-08-08 ENCOUNTER — Emergency Department (HOSPITAL_COMMUNITY): Payer: Medicaid Other

## 2023-08-08 DIAGNOSIS — G43809 Other migraine, not intractable, without status migrainosus: Secondary | ICD-10-CM | POA: Insufficient documentation

## 2023-08-08 DIAGNOSIS — Z20822 Contact with and (suspected) exposure to covid-19: Secondary | ICD-10-CM | POA: Insufficient documentation

## 2023-08-08 LAB — I-STAT CHEM 8, ED
BUN: 12 mg/dL (ref 6–20)
Calcium, Ion: 1.14 mmol/L — ABNORMAL LOW (ref 1.15–1.40)
Chloride: 106 mmol/L (ref 98–111)
Creatinine, Ser: 0.7 mg/dL (ref 0.44–1.00)
Glucose, Bld: 100 mg/dL — ABNORMAL HIGH (ref 70–99)
HCT: 42 % (ref 36.0–46.0)
Hemoglobin: 14.3 g/dL (ref 12.0–15.0)
Potassium: 4.2 mmol/L (ref 3.5–5.1)
Sodium: 139 mmol/L (ref 135–145)
TCO2: 22 mmol/L (ref 22–32)

## 2023-08-08 LAB — CBC
HCT: 45 % (ref 36.0–46.0)
Hemoglobin: 15.3 g/dL — ABNORMAL HIGH (ref 12.0–15.0)
MCH: 29.5 pg (ref 26.0–34.0)
MCHC: 34 g/dL (ref 30.0–36.0)
MCV: 86.9 fL (ref 80.0–100.0)
Platelets: 283 10*3/uL (ref 150–400)
RBC: 5.18 MIL/uL — ABNORMAL HIGH (ref 3.87–5.11)
RDW: 12.7 % (ref 11.5–15.5)
WBC: 9.2 10*3/uL (ref 4.0–10.5)
nRBC: 0 % (ref 0.0–0.2)

## 2023-08-08 LAB — HCG, QUANTITATIVE, PREGNANCY: hCG, Beta Chain, Quant, S: <1 m[IU]/mL (ref <5)

## 2023-08-08 LAB — RESP PANEL BY RT-PCR (RSV, FLU A&B, COVID)  RVPGX2
Influenza A by PCR: NEGATIVE
Influenza B by PCR: NEGATIVE
Resp Syncytial Virus by PCR: NEGATIVE
SARS Coronavirus 2 by RT PCR: NEGATIVE

## 2023-08-08 MED ORDER — DIPHENHYDRAMINE HCL 50 MG/ML IJ SOLN
25.0000 mg | Freq: Once | INTRAMUSCULAR | Status: AC
  Administered 2023-08-08: 25 mg via INTRAVENOUS
  Filled 2023-08-08: qty 1

## 2023-08-08 MED ORDER — KETOROLAC TROMETHAMINE 15 MG/ML IJ SOLN
15.0000 mg | Freq: Once | INTRAMUSCULAR | Status: AC
  Administered 2023-08-08: 15 mg via INTRAVENOUS
  Filled 2023-08-08: qty 1

## 2023-08-08 MED ORDER — PROCHLORPERAZINE EDISYLATE 10 MG/2ML IJ SOLN
10.0000 mg | Freq: Once | INTRAMUSCULAR | Status: AC
  Administered 2023-08-08: 10 mg via INTRAVENOUS
  Filled 2023-08-08: qty 2

## 2023-08-08 MED ORDER — PEDIALYTE PO SOLN
240.0000 mL | ORAL | Status: DC
Start: 2023-08-08 — End: 2023-08-08
  Administered 2023-08-08: 240 mL via ORAL
  Filled 2023-08-08: qty 1000

## 2023-08-08 MED ORDER — MAGNESIUM SULFATE 2 GM/50ML IV SOLN
2.0000 g | Freq: Once | INTRAVENOUS | Status: AC
  Administered 2023-08-08: 2 g via INTRAVENOUS
  Filled 2023-08-08: qty 50

## 2023-08-08 NOTE — ED Triage Notes (Signed)
Patient reports migraine and nausea since yesterday. Took Imitrex without relief.

## 2023-08-08 NOTE — ED Notes (Signed)
Pt discharged home. Discharge information discussed. No s/s of distress observed during discharge. 

## 2023-08-08 NOTE — ED Notes (Signed)
X3 no response

## 2023-08-08 NOTE — Discharge Instructions (Signed)
Your presentation today was consistent with a migraine with aura, I am glad that you are feeling improved after the medication we have given you in the emergency department.  I would recommend resting for the rest of the day, drinking plenty of fluids to make sure that your symptoms continue to improve.  If you continue having frequent migraines that require hospitalization to improve I recommend a close follow-up with your primary care doctor to discuss possible neurology consultation for additional medications for migraines.  9 please return to the emergency department if you have return of severe headache, numbness, tingling.

## 2023-08-08 NOTE — ED Provider Notes (Signed)
[facility] Provider Note   CSN: [id number] Arrival date & time: 08/08/23  1026     History  Chief Complaint  Patient presents with   Migraine    [name] is a 27 y.o. female with past medical history significant for nausea, vomiting, anxiety, chronic migraines, medication overuse headache who presents concern for headache since yesterday with nausea.  Patient reports that she tried to go to [facility], they gave her some kind of an injection and Zofran with no relief of her symptoms.  She reports that she took her home Imitrex without relief.  She reports that this feels different than her normal migraines because it started suddenly she does have some decree sensation on the right side of the face and feels some weakness in the left arm.  She reports she does not normally have any facial sensitivity with migraines.  She denies any fall, head injury.  She denies any recent sick contacts.   Migraine       Home Medications Prior to Admission medications   Not on File      Allergies    Amoxicillin, Penicillins, Phenergan [promethazine], and Penicillins    Review of Systems   Review of Systems  All other systems reviewed and are negative.   Physical Exam Updated Vital Signs BP 119/66 (BP Location: Right Arm)   Pulse 70   Temp 97.6 F (36.4 C) (Oral)   Resp 18   Ht 5\' 5"  (1.651 m)   Wt 95.7 kg   SpO2 100%   BMI 35.11 kg/m  Physical Exam Vitals and nursing note reviewed.  Constitutional:      General: She is not in acute distress.    Appearance: Normal appearance.  HENT:     Head: Normocephalic and atraumatic.  Eyes:     General:        Right eye: No discharge.        Left eye: No discharge.  Cardiovascular:     Rate and Rhythm: Normal rate and regular rhythm.     Heart sounds: No murmur heard.    No friction rub. No gallop.  Pulmonary:     Effort: Pulmonary effort is normal.     Breath sounds: Normal breath  sounds.  Abdominal:     General: Bowel sounds are normal.     Palpations: Abdomen is soft.  Skin:    General: Skin is warm and dry.     Capillary Refill: Capillary refill takes less than 2 seconds.  Neurological:     Mental Status: She is alert and oriented to person, place, and time.     Comments: Reports decreased sensation on the right side of the face, otherwise Cranial nerves II through XII grossly intact.  Intact finger-nose, intact heel-to-shin.  Romberg negative, gait normal.  Alert and oriented x3.  Moves all 4 limbs spontaneously, normal coordination.  No pronator drift.  Intact strength 5 out of 5 bilateral upper and lower extremities.   Psychiatric:        Mood and Affect: Mood normal.        Behavior: Behavior normal.     ED Results / Procedures / Treatments   Labs (all labs ordered are listed, but only abnormal results are displayed) Labs Reviewed  CBC - Abnormal; Notable for the following components:      Result Value   RBC 5.18 (*)    Hemoglobin 15.3 (*)    All other components within  normal limits  I-STAT CHEM 8, ED - Abnormal; Notable for the following components:   Glucose, Bld 100 (*)    Calcium, Ion 1.14 (*)    All other components within normal limits  RESP PANEL BY RT-PCR (RSV, FLU A&B, COVID)  RVPGX2  HCG, QUANTITATIVE, PREGNANCY  BASIC METABOLIC PANEL    EKG None  Radiology CT Head Wo Contrast  Result Date: 08/08/2023 CLINICAL DATA:  Headache, neuro deficit, migraine and nausea since yesterday EXAM: CT HEAD WITHOUT CONTRAST TECHNIQUE: Contiguous axial images were obtained from the base of the skull through the vertex without intravenous contrast. RADIATION DOSE REDUCTION: This exam was performed according to the departmental dose-optimization program which includes automated exposure control, adjustment of the mA and/or kV according to patient size and/or use of iterative reconstruction technique. COMPARISON:  No prior CT head available, correlation  is made with 08/04/2021 MRI head FINDINGS: Brain: No evidence of acute infarction, hemorrhage, mass, mass effect, or midline shift. No hydrocephalus or extra-axial fluid collection. Gray-white differentiation is preserved. The basilar cisterns are patent. Vascular: No hyperdense vessel. Skull: Negative for fracture or focal lesion. Sinuses/Orbits: No acute finding. Other: The mastoid air cells are well aerated. IMPRESSION: No acute intracranial process. Electronically Signed   By: Wiliam Ke M.D.   On: 08/08/2023 12:30    Procedures Procedures    Medications Ordered in ED Medications  Pedialyte solution SOLN 240 mL (0 mLs Oral Stopped 08/08/23 1420)  prochlorperazine (COMPAZINE) injection 10 mg (10 mg Intravenous Given 08/08/23 1142)  diphenhydrAMINE (BENADRYL) injection 25 mg (25 mg Intravenous Given 08/08/23 1142)  ketorolac (TORADOL) 15 MG/ML injection 15 mg (15 mg Intravenous Given 08/08/23 1142)  magnesium sulfate IVPB 2 g 50 mL (0 g Intravenous Stopped 08/08/23 1216)    ED Course/ Medical Decision Making/ A&P                                 Medical Decision Making Amount and/or Complexity of Data Reviewed Labs: ordered. Radiology: ordered.  Risk OTC drugs. Prescription drug management.   This patient is a 27 y.o. female  who presents to the ED for concern of headache, abnormal facial sensation.   Differential diagnoses prior to evaluation: The emergent differential diagnosis includes, but is not limited to,  Stroke, increased ICP, meningitis, CVA, intracranial tumor, venous sinus thrombosis, migraine, cluster headache, hypertension, drug related, head injury, tension headache, sinusitis, dental abscess, otitis media, TMJ . This is not an exhaustive differential.   Past Medical History / Co-morbidities / Social History: Hx of migraines  Additional history: Chart reviewed. Pertinent results include: Chronic migraines  Physical Exam: Physical exam performed. The  pertinent findings include: Patient endorses some abnormal sensation of the right side of her face, otherwise with unremarkable neurologic exam.  After headache improved, and reevaluation patient reports sensation is back to normal.  Lab Tests/Imaging studies: I personally interpreted labs/imaging and the pertinent results include: CBC unremarkable, RVP negative for COVID, flu, RSV, Chem-8 without any significant abnormalities, her initial BMP hemolyzed but overall low clinical suspicion for electrolyte abnormalities I think the i-STAT is sufficient in the setting.  Patient is not pregnant.  I independently interpreted CT head without contrast which shows no evidence of acute intracranial abnormality.. I agree with the radiologist interpretation.  Medications: I ordered medication including migraine cocktail, on reassessment patient reports headache is markedly improved.  I have reviewed the patients home medicines and have made  adjustments as needed.   Disposition: After consideration of the diagnostic results and the patients response to treatment, I feel that patient is stable for discharge at this time, discussed if she continues to have significant chronic migraines that she should speak to her PCP about further treatment, possible preventative medications, possible neurologic follow-up.   emergency department workup does not suggest an emergent condition requiring admission or immediate intervention beyond what has been performed at this time. The plan is: as above. The patient is safe for discharge and has been instructed to return immediately for worsening symptoms, change in symptoms or any other concerns.  Final Clinical Impression(s) / ED Diagnoses Final diagnoses:  Other migraine without status migrainosus, not intractable    Rx / DC Orders ED Discharge Orders     None         Olene Floss, PA-C 08/08/23 1503    Pricilla Loveless, MD 08/12/23 1500

## 2023-09-28 ENCOUNTER — Other Ambulatory Visit: Payer: Self-pay

## 2023-09-28 ENCOUNTER — Emergency Department (HOSPITAL_BASED_OUTPATIENT_CLINIC_OR_DEPARTMENT_OTHER)
Admission: EM | Admit: 2023-09-28 | Discharge: 2023-09-29 | Discharge status: Against Medical Advice | Payer: Medicaid Other | Attending: Emergency Medicine | Admitting: Emergency Medicine

## 2023-09-28 DIAGNOSIS — J029 Acute pharyngitis, unspecified: Secondary | ICD-10-CM | POA: Diagnosis not present

## 2023-09-28 DIAGNOSIS — Z5321 Procedure and treatment not carried out due to patient leaving prior to being seen by health care provider: Secondary | ICD-10-CM | POA: Diagnosis not present

## 2023-09-28 DIAGNOSIS — M791 Myalgia, unspecified site: Secondary | ICD-10-CM | POA: Diagnosis present

## 2023-09-28 DIAGNOSIS — Z20822 Contact with and (suspected) exposure to covid-19: Secondary | ICD-10-CM | POA: Diagnosis not present

## 2023-09-28 LAB — RESP PANEL BY RT-PCR (RSV, FLU A&B, COVID)  RVPGX2
Influenza A by PCR: NEGATIVE
Influenza B by PCR: NEGATIVE
Resp Syncytial Virus by PCR: NEGATIVE
SARS Coronavirus 2 by RT PCR: NEGATIVE

## 2023-09-28 LAB — PREGNANCY, URINE: Preg Test, Ur: NEGATIVE

## 2023-09-28 LAB — GROUP A STREP BY PCR: Group A Strep by PCR: NOT DETECTED

## 2023-09-28 NOTE — ED Triage Notes (Signed)
Pt POV from home reporting gen body aches and sore throat x3 days. No fever or n/v/d. Also concerned she may be pregnant.

## 2023-09-29 NOTE — ED Notes (Signed)
Pt did not answer when called times 3 by staff

## 2024-09-19 ENCOUNTER — Encounter: Admitting: Nurse Practitioner

## 2024-09-19 DIAGNOSIS — G43711 Chronic migraine without aura, intractable, with status migrainosus: Secondary | ICD-10-CM

## 2024-09-19 NOTE — Progress Notes (Signed)
 [name] has logged in several times outside of her appointment time.
